# Patient Record
Sex: Female | Born: 1935 | Race: White | Hispanic: No | Smoking: Never smoker
Health system: Southern US, Community
[De-identification: ages and names within clinical notes are randomized; demographics above are authoritative.]

## PROBLEM LIST (undated history)

## (undated) DIAGNOSIS — I10 Essential (primary) hypertension: Secondary | ICD-10-CM

## (undated) HISTORY — PX: TONSILLECTOMY AND ADENOIDECTOMY: SHX28

## (undated) HISTORY — PX: ELBOW LIGAMENT RECONSTRUCTION: SHX6359

---

## 2007-03-07 ENCOUNTER — Encounter: Payer: Self-pay | Admitting: Unknown Physician Specialty

## 2007-04-05 ENCOUNTER — Encounter: Payer: Self-pay | Admitting: Unknown Physician Specialty

## 2007-07-12 ENCOUNTER — Ambulatory Visit: Payer: Self-pay | Admitting: Physician Assistant

## 2007-08-09 ENCOUNTER — Ambulatory Visit: Payer: Self-pay | Admitting: Pain Medicine

## 2007-08-21 ENCOUNTER — Ambulatory Visit: Payer: Self-pay | Admitting: Pain Medicine

## 2007-08-22 ENCOUNTER — Ambulatory Visit: Payer: Self-pay | Admitting: Pain Medicine

## 2007-09-06 ENCOUNTER — Ambulatory Visit: Payer: Self-pay | Admitting: Physician Assistant

## 2007-10-18 ENCOUNTER — Ambulatory Visit: Payer: Self-pay | Admitting: Pain Medicine

## 2007-10-26 ENCOUNTER — Ambulatory Visit: Payer: Self-pay | Admitting: Pain Medicine

## 2007-11-09 ENCOUNTER — Ambulatory Visit: Payer: Self-pay | Admitting: Pain Medicine

## 2007-11-22 ENCOUNTER — Ambulatory Visit: Payer: Self-pay | Admitting: Physician Assistant

## 2008-01-08 ENCOUNTER — Ambulatory Visit: Payer: Self-pay | Admitting: Family Medicine

## 2008-01-12 ENCOUNTER — Ambulatory Visit: Payer: Self-pay | Admitting: Family Medicine

## 2008-01-31 ENCOUNTER — Ambulatory Visit: Payer: Self-pay | Admitting: Family Medicine

## 2008-06-05 LAB — HM PAP SMEAR: HM Pap smear: NEGATIVE

## 2008-06-06 ENCOUNTER — Ambulatory Visit: Payer: Self-pay | Admitting: Family Medicine

## 2008-06-12 ENCOUNTER — Encounter: Payer: Self-pay | Admitting: Family Medicine

## 2008-07-04 ENCOUNTER — Encounter: Payer: Self-pay | Admitting: Family Medicine

## 2008-07-22 ENCOUNTER — Ambulatory Visit: Payer: Self-pay | Admitting: Family Medicine

## 2008-08-04 ENCOUNTER — Encounter: Payer: Self-pay | Admitting: Family Medicine

## 2008-08-08 ENCOUNTER — Ambulatory Visit: Payer: Self-pay | Admitting: Family Medicine

## 2009-03-21 ENCOUNTER — Ambulatory Visit: Payer: Self-pay

## 2009-05-07 ENCOUNTER — Ambulatory Visit: Payer: Self-pay | Admitting: Internal Medicine

## 2009-07-31 DIAGNOSIS — I83009 Varicose veins of unspecified lower extremity with ulcer of unspecified site: Secondary | ICD-10-CM | POA: Insufficient documentation

## 2010-05-31 IMAGING — CR DG CHEST 2V
1 series · 2 of 2 positions shown · non-contrast
Comparison: none

REASON FOR EXAM: ABNORMAL WEIGHT LOSS
COMMENTS:

[Series 1: view not recorded · 0.17mm/px · 2 of 2 slices shown]
[im 1/2]
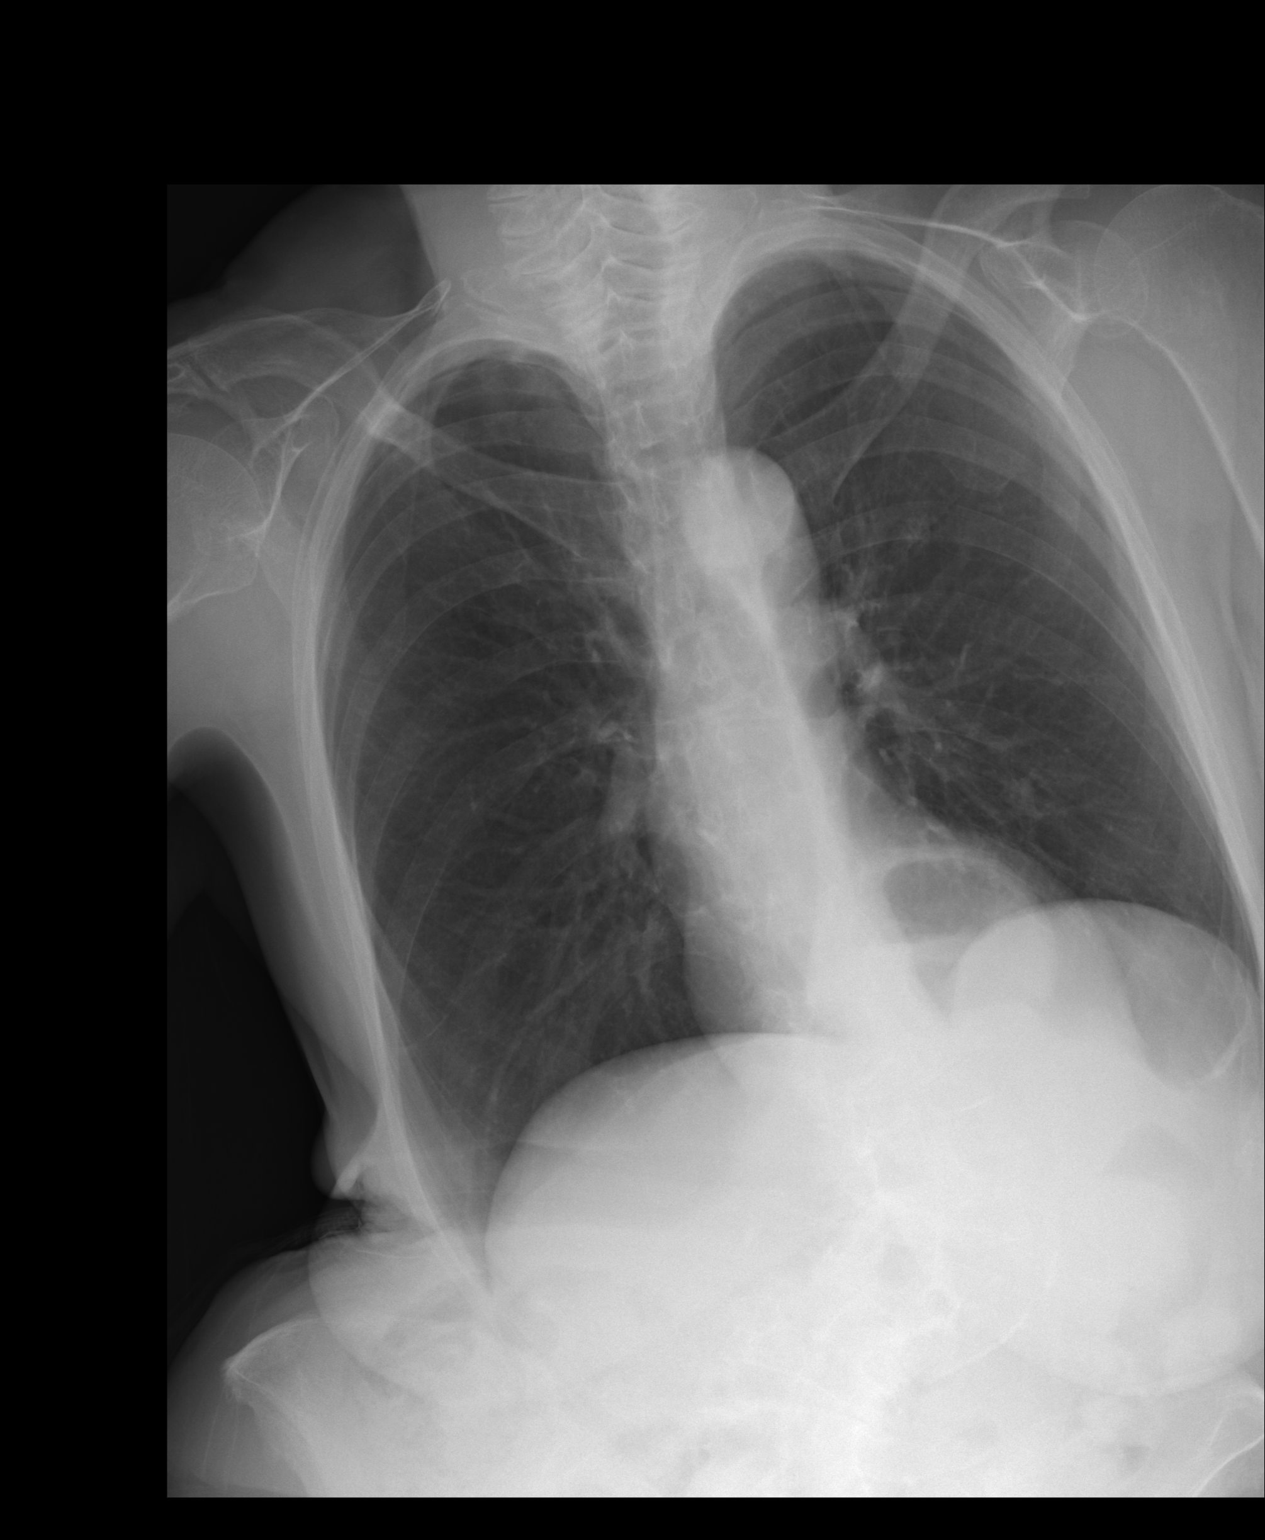
[im 2/2]
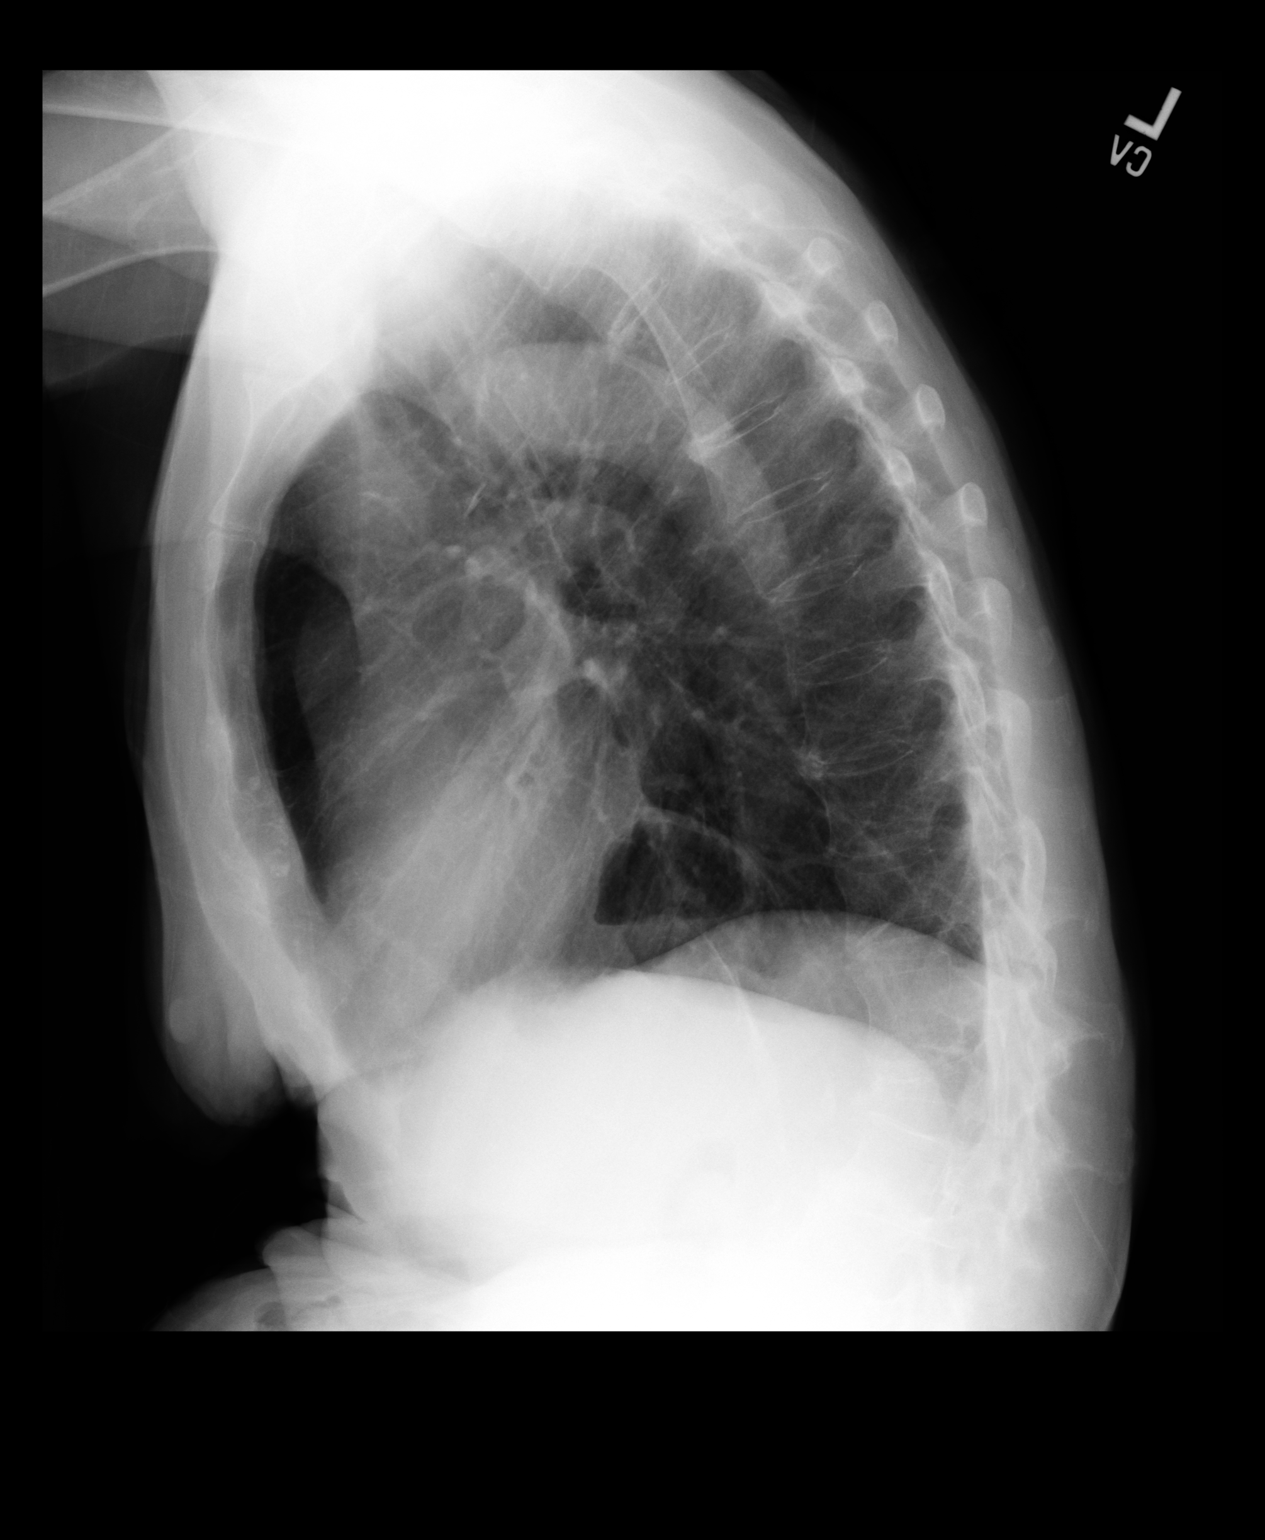

[2 of 2 positions shown; findings below may reference images not displayed]

PROCEDURE:     DXR - DXR CHEST PA (OR AP) AND LATERAL  - January 08, 2008  [DATE]

RESULT:     There is no evidence of focal infiltrates, effusions or edema.
There does appear to be mild increased AP diameter of the chest. An air
fluid level is identified within the retrocardiac region likely representing
a hiatal hernia. The cardiac silhouette is within normal limits. Severe
S-shaped scoliosis is identified within the lower thoracolumbar and lumbar
spine.
IMPRESSION: 1. Chest radiograph without evidence of acute cardiopulmonary disease.

## 2010-06-04 IMAGING — CT CT ABDOMEN W/ CM
1 of 2 series · 14 of 32 positions shown, 19 images · non-contrast
Comparison: none

REASON FOR EXAM: Abnormal weight loss
COMMENTS:

[Series 2: abdomen · axial · 0.63mm/px · z∈[-268,-42]mm · 14 of 51 slices shown, 19 images]
[im 3/51  soft-tissue]
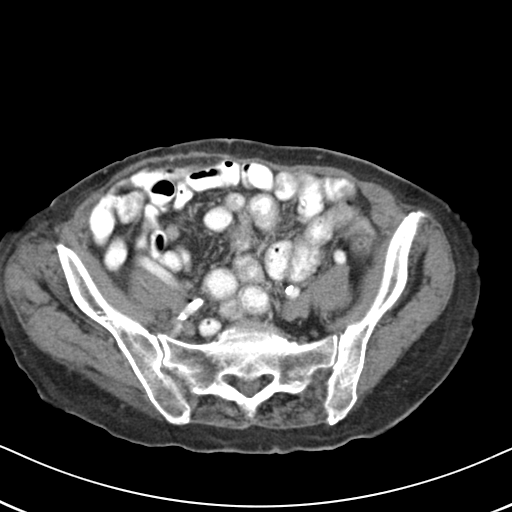
[im 3/51  bone]
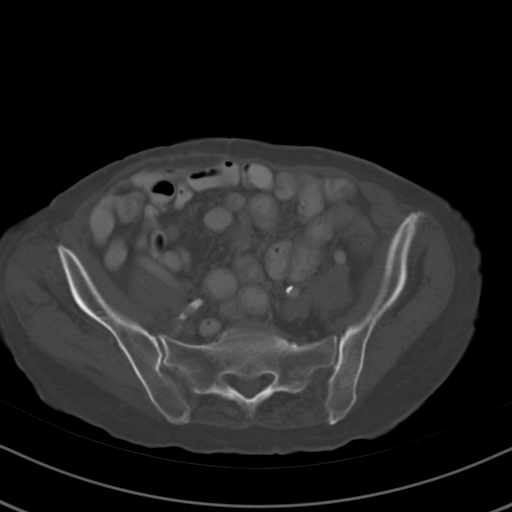
[im 8/51  soft-tissue]
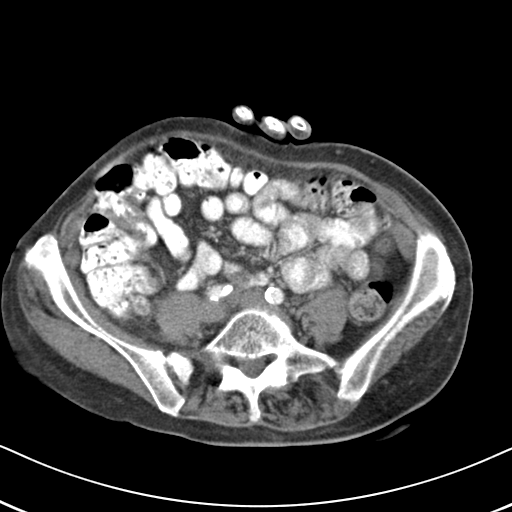
[im 11/51  soft-tissue]
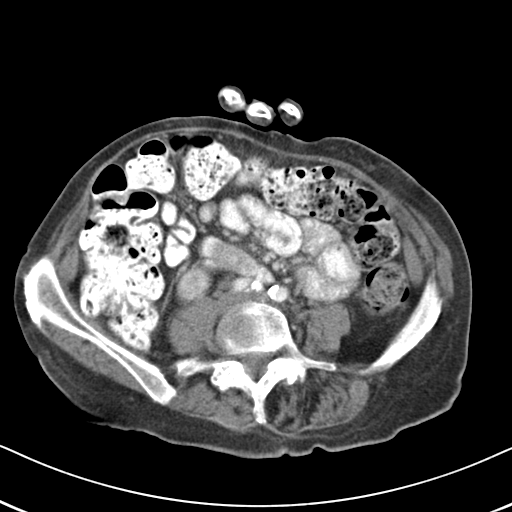
[im 16/51  soft-tissue]
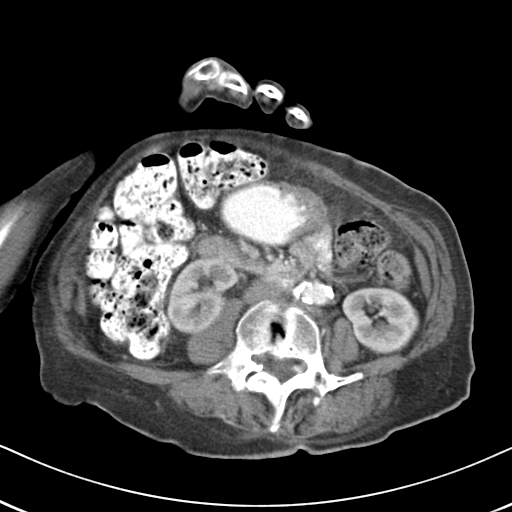
[im 18/51  soft-tissue]
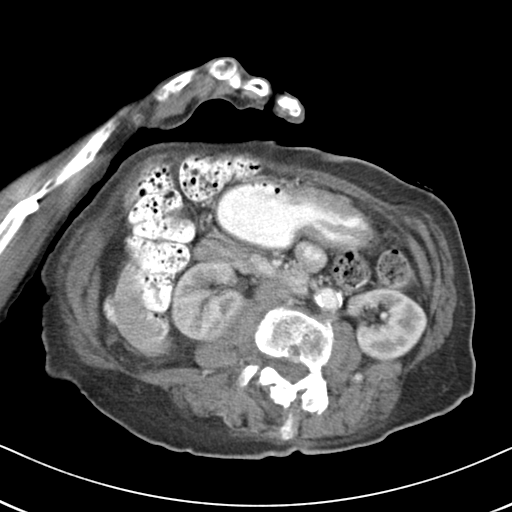
[im 23/51  soft-tissue]
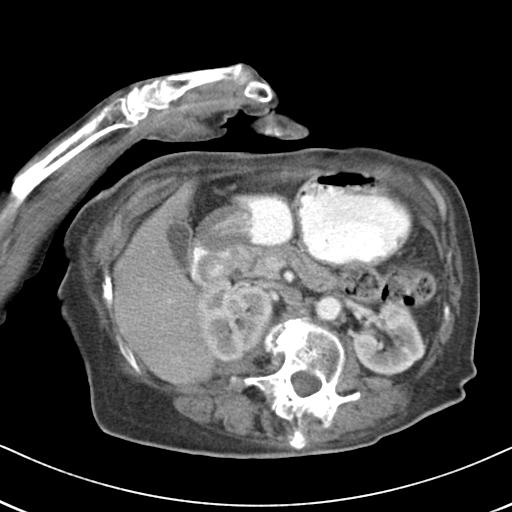
[im 26/51  soft-tissue]
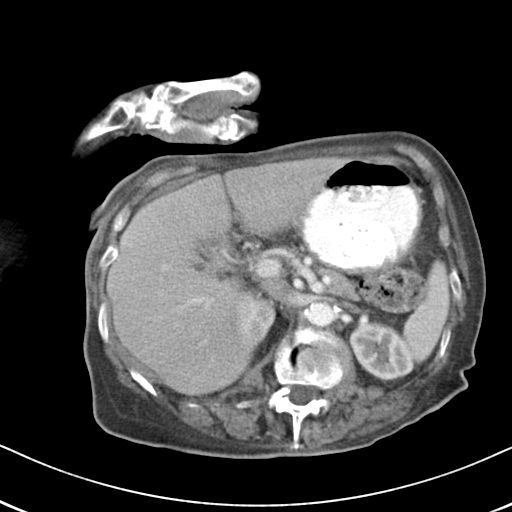
[im 28/51  soft-tissue]
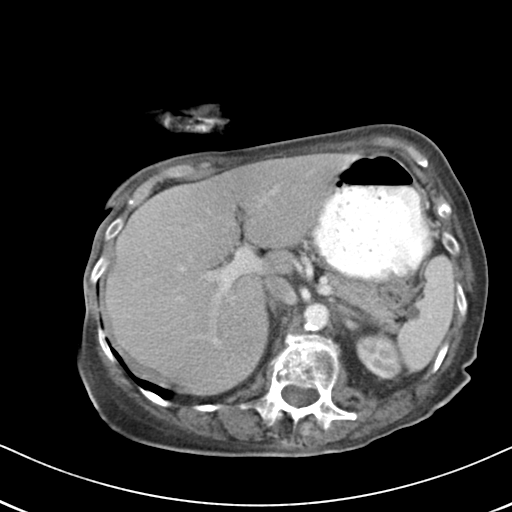
[im 33/51  soft-tissue]
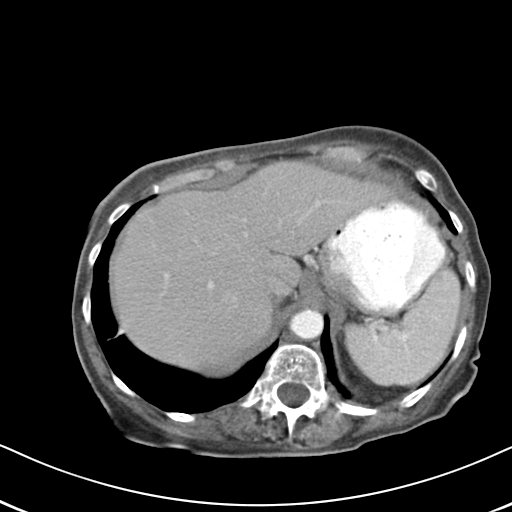
[im 33/51  bone]
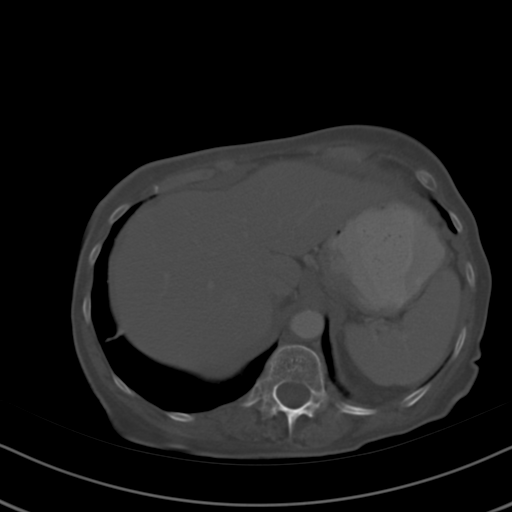
[im 36/51  soft-tissue]
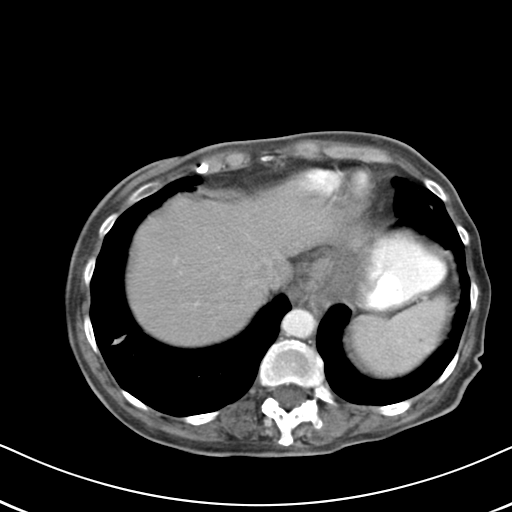
[im 41/51  soft-tissue]
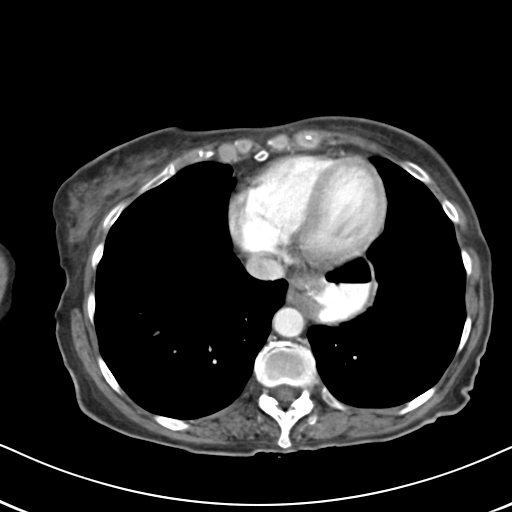
[im 41/51  lung]
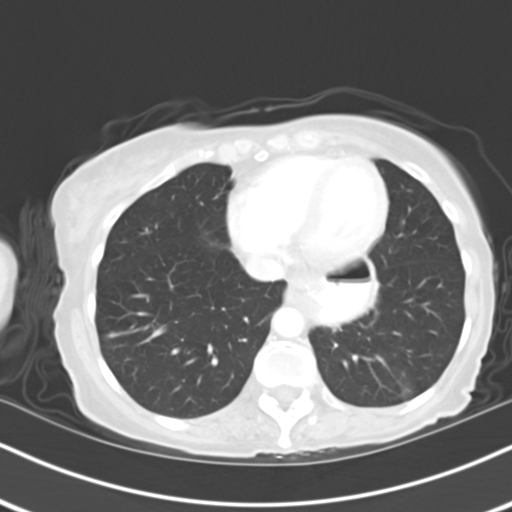
[im 43/51  soft-tissue]
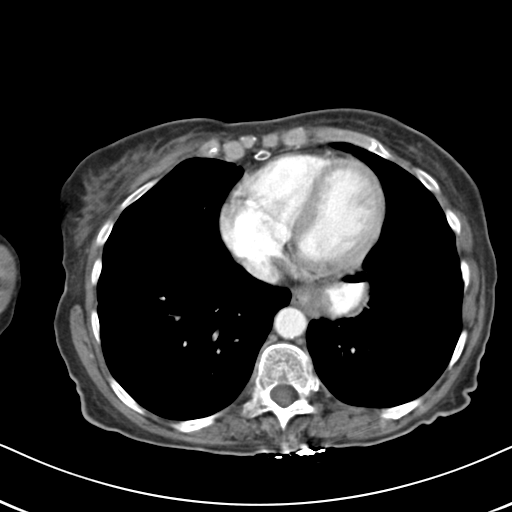
[im 43/51  lung]
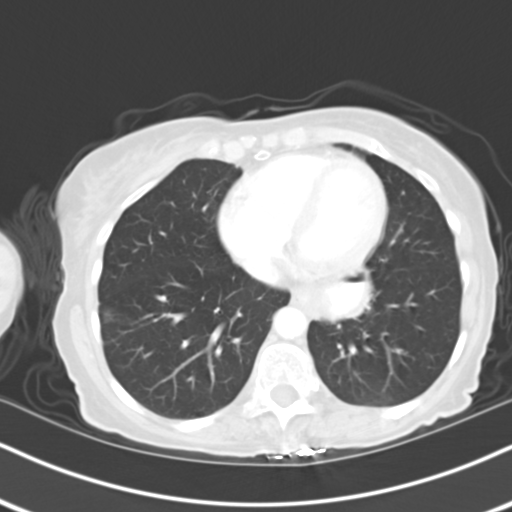
[im 46/51  lung]
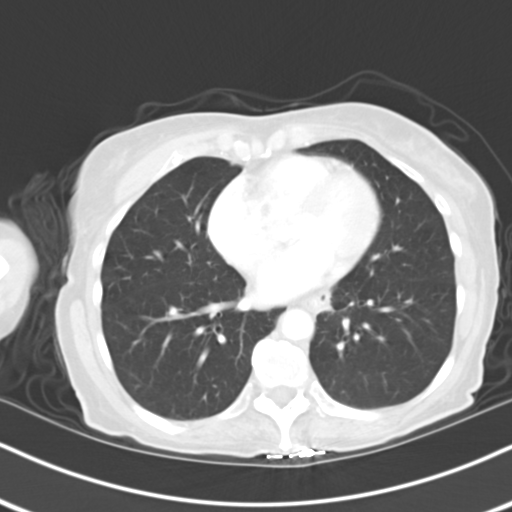
[im 48/51  soft-tissue]
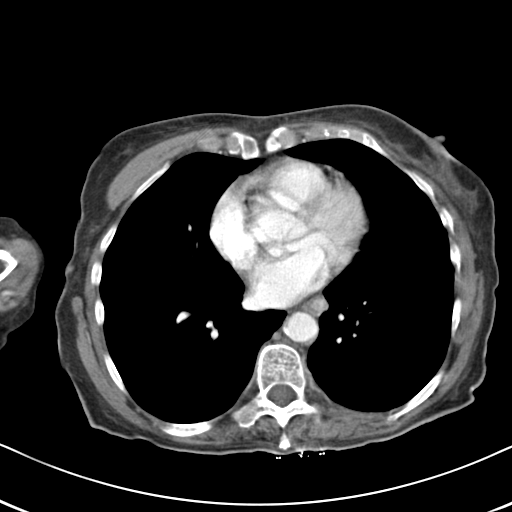
[im 48/51  lung]
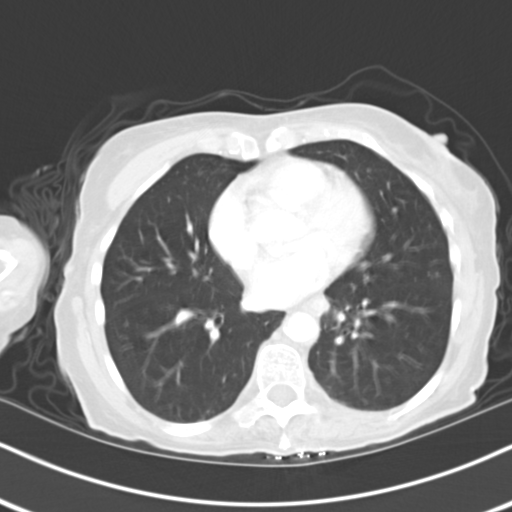

[14 of 32 positions shown; findings below may reference images not displayed]

PROCEDURE:     CT  - CT ABDOMEN STANDARD W  - January 12, 2008  [DATE]

RESULT:     CT of the abdomen and is performed utilizing approximately 75 ml
of Ssovue-IVC iodinated intravenous contrast. Oral contrast is also
utilized. Images are reconstructed in the axial plane at 5 mm slice
thickness.

There is no previous examination available for comparison.
FINDINGS: Images through the lung bases demonstrate respiratory motion
artifact without a discrete mass or definite infiltrate. Some minimal linear
fibrosis or atelectasis is present. There is a large, hiatal hernia. The
liver and spleen appear unremarkable. Prominent atherosclerotic
calcification is seen in the aorta and iliac arteries. No definite aneurysm
is identified. The gallbladder is present with no stones demonstrated. There
is no definite mesenteric or retroperitoneal mass or adenopathy. The
included portions of the pelvis are unremarkable. There is a moderate amount
of stool in the colon. No abnormal bowel distention or bowel wall thickening
is appreciated. The adrenal glands and pancreas appear to be within normal
limits.
IMPRESSION: 1.  Advanced atherosclerotic disease in the aorta and iliac arteries.
2.  Moderately, large, hiatal hernia.
3.  No evidence of malignancy on these images. There is no definite mass or
adenopathy. There is no ascites.

## 2010-11-30 ENCOUNTER — Ambulatory Visit: Payer: Self-pay | Admitting: Family Medicine

## 2011-08-29 ENCOUNTER — Inpatient Hospital Stay: Payer: Self-pay | Admitting: Internal Medicine

## 2011-08-29 LAB — BASIC METABOLIC PANEL
Anion Gap: 11 (ref 7–16)
BUN: 29 mg/dL — ABNORMAL HIGH (ref 7–18)
Calcium, Total: 8.4 mg/dL — ABNORMAL LOW (ref 8.5–10.1)
Co2: 22 mmol/L (ref 21–32)
Creatinine: 0.93 mg/dL (ref 0.60–1.30)
EGFR (African American): >60
EGFR (Non-African Amer.): >60
Glucose: 75 mg/dL (ref 65–99)
Potassium: 3.6 mmol/L (ref 3.5–5.1)
Sodium: 115 mmol/L — CL (ref 136–145)

## 2011-08-29 LAB — COMPREHENSIVE METABOLIC PANEL
Albumin: 2.4 g/dL — ABNORMAL LOW (ref 3.4–5.0)
Albumin: 3.2 g/dL — ABNORMAL LOW (ref 3.4–5.0)
Alkaline Phosphatase: 123 U/L (ref 50–136)
Alkaline Phosphatase: 95 U/L (ref 50–136)
Anion Gap: 13 (ref 7–16)
BUN: 26 mg/dL — ABNORMAL HIGH (ref 7–18)
Bilirubin,Total: 0.4 mg/dL (ref 0.2–1.0)
Bilirubin,Total: 0.5 mg/dL (ref 0.2–1.0)
Calcium, Total: 8.7 mg/dL (ref 8.5–10.1)
Chloride: 77 mmol/L — ABNORMAL LOW (ref 98–107)
Chloride: 88 mmol/L — ABNORMAL LOW (ref 98–107)
Co2: 20 mmol/L — ABNORMAL LOW (ref 21–32)
Creatinine: 0.7 mg/dL (ref 0.60–1.30)
EGFR (African American): >60
EGFR (Non-African Amer.): >60
Glucose: 57 mg/dL — ABNORMAL LOW (ref 65–99)
Glucose: 96 mg/dL (ref 65–99)
Osmolality: 242 (ref 275–301)
SGOT(AST): 32 U/L (ref 15–37)
Sodium: 110 mmol/L — CL (ref 136–145)
Sodium: 119 mmol/L — CL (ref 136–145)
Total Protein: 5.2 g/dL — ABNORMAL LOW (ref 6.4–8.2)
Total Protein: 7.2 g/dL (ref 6.4–8.2)

## 2011-08-29 LAB — URINALYSIS, COMPLETE
Glucose,UR: 50 mg/dL (ref 0–75)
Ketone: NEGATIVE
Leukocyte Esterase: NEGATIVE
Nitrite: NEGATIVE
Squamous Epithelial: <1
WBC UR: 1 /HPF (ref 0–5)

## 2011-08-29 LAB — SODIUM
Sodium: 116 mmol/L — CL (ref 136–145)
Sodium: 118 mmol/L — CL (ref 136–145)
Sodium: 118 mmol/L — CL (ref 136–145)
Sodium: 119 mmol/L — CL (ref 136–145)

## 2011-08-29 LAB — CBC
MCHC: 34.9 g/dL (ref 32.0–36.0)
Platelet: 195 10*3/uL (ref 150–440)
RBC: 3.89 10*6/uL (ref 3.80–5.20)
RDW: 13.6 % (ref 11.5–14.5)
WBC: 8.3 10*3/uL (ref 3.6–11.0)

## 2011-08-29 LAB — OSMOLALITY, URINE: Osmolality: 241 mOsm/kg

## 2011-08-29 LAB — TROPONIN I: Troponin-I: <0.02 ng/mL

## 2011-08-29 LAB — OSMOLALITY: Osmolality: 234 mOsm/kg — CL (ref 280–301)

## 2011-08-29 LAB — CK TOTAL AND CKMB (NOT AT ARMC): CK-MB: 3.2 ng/mL (ref 0.5–3.6)

## 2011-08-30 LAB — COMPREHENSIVE METABOLIC PANEL
Anion Gap: 9 (ref 7–16)
BUN: 26 mg/dL — ABNORMAL HIGH (ref 7–18)
Calcium, Total: 8 mg/dL — ABNORMAL LOW (ref 8.5–10.1)
Chloride: 92 mmol/L — ABNORMAL LOW (ref 98–107)
Co2: 21 mmol/L (ref 21–32)
Osmolality: 249 (ref 275–301)
Potassium: 3.6 mmol/L (ref 3.5–5.1)
SGOT(AST): 22 U/L (ref 15–37)
SGPT (ALT): 13 U/L (ref 12–78)
Sodium: 122 mmol/L — ABNORMAL LOW (ref 136–145)
Total Protein: 5.7 g/dL — ABNORMAL LOW (ref 6.4–8.2)

## 2011-08-30 LAB — CBC WITH DIFFERENTIAL/PLATELET
Basophil #: 0.1 10*3/uL (ref 0.0–0.1)
Basophil %: 0.8 %
Eosinophil %: 0.4 %
HCT: 36.6 % (ref 35.0–47.0)
HGB: 12.8 g/dL (ref 12.0–16.0)
Lymphocyte #: 1.3 10*3/uL (ref 1.0–3.6)
MCH: 30.8 pg (ref 26.0–34.0)
MCHC: 34.8 g/dL (ref 32.0–36.0)
MCV: 89 fL (ref 80–100)
Monocyte #: 1.1 x10 3/mm — ABNORMAL HIGH (ref 0.2–0.9)
Platelet: 179 10*3/uL (ref 150–440)
RBC: 4.14 10*6/uL (ref 3.80–5.20)
WBC: 9 10*3/uL (ref 3.6–11.0)

## 2011-08-30 LAB — LIPID PANEL
Cholesterol: 133 mg/dL (ref 0–200)
HDL Cholesterol: 47 mg/dL (ref 40–60)
Triglycerides: 78 mg/dL (ref 0–200)
VLDL Cholesterol, Calc: 16 mg/dL (ref 5–40)

## 2011-08-30 LAB — SODIUM: Sodium: 124 mmol/L — ABNORMAL LOW (ref 136–145)

## 2011-08-31 LAB — PROTEIN / CREATININE RATIO, URINE
Creatinine, Urine: 37.3 mg/dL (ref 30.0–125.0)
Protein, Random Urine: 9 mg/dL (ref 0–12)
Protein/Creat. Ratio: 241 mg/gCREAT — ABNORMAL HIGH (ref 0–200)

## 2011-08-31 LAB — SODIUM
Sodium: 129 mmol/L — ABNORMAL LOW (ref 136–145)
Sodium: 129 mmol/L — ABNORMAL LOW (ref 136–145)

## 2011-08-31 LAB — CHLORIDE, URINE, RANDOM: Chloride, Urine Random: 53 mmol/L — ABNORMAL LOW (ref 55–125)

## 2011-09-01 LAB — BASIC METABOLIC PANEL
Anion Gap: 6 — ABNORMAL LOW (ref 7–16)
BUN: 19 mg/dL — ABNORMAL HIGH (ref 7–18)
Chloride: 103 mmol/L (ref 98–107)
Co2: 24 mmol/L (ref 21–32)
EGFR (African American): >60
EGFR (Non-African Amer.): >60
Glucose: 76 mg/dL (ref 65–99)
Osmolality: 267 (ref 275–301)
Potassium: 3.8 mmol/L (ref 3.5–5.1)
Sodium: 133 mmol/L — ABNORMAL LOW (ref 136–145)

## 2011-09-02 ENCOUNTER — Encounter: Payer: Self-pay | Admitting: Internal Medicine

## 2011-09-05 ENCOUNTER — Encounter: Payer: Self-pay | Admitting: Internal Medicine

## 2011-09-14 LAB — URINALYSIS, COMPLETE
Bilirubin,UR: NEGATIVE
Glucose,UR: NEGATIVE mg/dL (ref 0–75)
Hyaline Cast: 4
Nitrite: NEGATIVE
Ph: 6 (ref 4.5–8.0)
Protein: NEGATIVE
Specific Gravity: 1.011 (ref 1.003–1.030)
Squamous Epithelial: <1
WBC UR: 1 /HPF (ref 0–5)

## 2011-09-16 LAB — URINE CULTURE

## 2013-06-13 ENCOUNTER — Ambulatory Visit: Payer: Self-pay | Admitting: Family Medicine

## 2013-06-13 LAB — HM DEXA SCAN

## 2013-08-03 LAB — CBC AND DIFFERENTIAL
HCT: 39 % (ref 36–46)
Hemoglobin: 13.6 g/dL (ref 12.0–16.0)
PLATELETS: 235 10*3/uL (ref 150–399)
WBC: 4.7 10^3/mL

## 2013-08-03 LAB — BASIC METABOLIC PANEL
BUN: 13 mg/dL (ref 4–21)
CREATININE: 0.8 mg/dL (ref 0.5–1.1)
GLUCOSE: 95 mg/dL
POTASSIUM: 4.1 mmol/L (ref 3.4–5.3)
SODIUM: 139 mmol/L (ref 137–147)

## 2013-08-03 LAB — HEPATIC FUNCTION PANEL
ALT: 12 U/L (ref 7–35)
AST: 18 U/L (ref 13–35)

## 2014-01-19 IMAGING — CR DG CHEST 1V PORT
1 series · 1 of 1 positions shown · non-contrast
Comparison: none

REASON FOR EXAM: line placement
COMMENTS:

PROCEDURE:     DXR - DXR PORTABLE CHEST SINGLE VIEW  - August 29, 2011  [DATE]
RESULT:     From the chest dated 08/29/2011. Comparison made to prior study
dated 08/28/2011.

[portable]
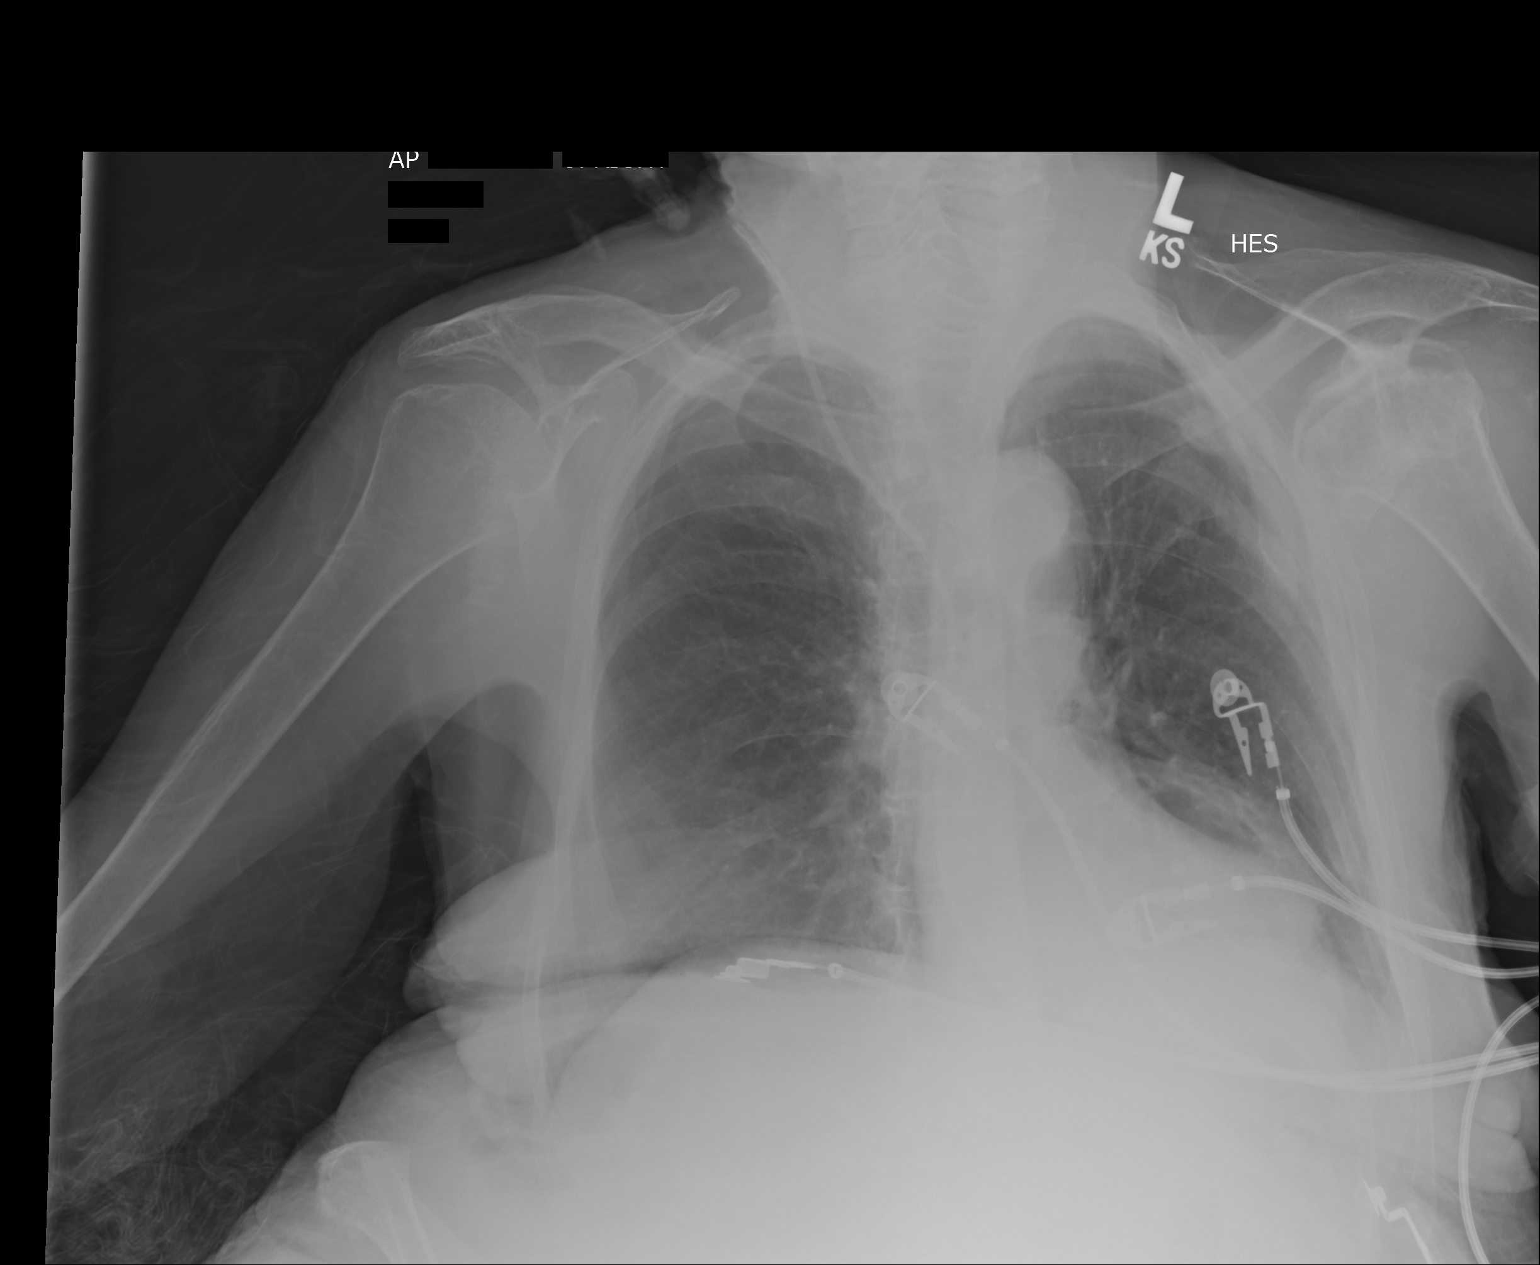

[1 of 1 positions shown; findings below may reference images not displayed]

FINDINGS: A right-sided internal jugular catheter is appreciated with tip
projecting in the region of the right atrium. There is no evidence of
pneumothorax. Is prominence of interstitial markings. Within the left lung
base there is an area of atelectasis versus possibly infiltrate projecting
along the superior aspect of the left ventricle.
IMPRESSION: Patient status post right internal jugular catheter
placement the tip projecting in the region of the right atrium. There is no
evidence of pneumothorax.
2. Atelectasis versus infiltrate on the left.

## 2014-04-23 NOTE — Discharge Summary (Signed)
PATIENT NAME:  Jasmin GrumblingWRIGHT, Elwanda S MR#:  [id number] DATE OF BIRTH:  October 24, 1933  DATE OF ADMISSION:  08/29/2011 DATE OF DISCHARGE:    Discharge is pending bed opening at rehab.   PRESENTING COMPLAINT: Weakness, altered mental status.   DISCHARGE DIAGNOSES:  1. Hypo-osmolar, hypovolemia, hyponatremia. The patient's sodium improved to 133.  2. Urinary tract infection, resolved.  3. Hypoalbuminemia consistent with moderate malnutrition.  4. Hypertension.  5. Generalized weakness.   CONDITION ON DISCHARGE: Fair.   MEDICATIONS ON DISCHARGE:  1. Nexium 40 mg p.o. daily.  2. Calcium with Vitamin D 1 tablet b.i.d.  3. Fosamax 70 mg p.o. q. weekly.  4. Tylenol Extra Strength 500 mg q.6 p.r.n.  5. Amlodipine 5 mg daily.  6. Lovaza 1 gram orally daily. 7. Bacitracin to affected area t.i.d. as needed.  8. Ultram 50 mg p.o. q.6 p.r.n. for pain.   CONSULTATION: Nephrology consultation with Dr. Thedore MinsSingh for hyponatremia   LABORATORY, DIAGNOSTIC, AND RADIOLOGICAL DATA: Serum sodium 133, glucose 76, BUN 19, creatinine 0.66, potassium 3.8. Protein electrophoresis M-spike noted. Urine chloride 53. Urine creatinine is 37.3. Urine sodium is 48. Free Kappa and Lambda Light chains are within normal limits. Serum protein electrophoresis M-spike not observed. Serum cortisol is 20.4. Lipid profile within normal limits. Serum sodium on admission was 110. Urinalysis negative for urinary tract infection. Blood cultures negative.   BRIEF SUMMARY OF HOSPITAL COURSE: [name] is a 81 year old Caucasian female with history of hypertension who came in with:  1. Altered mental status which was suspected due to hyponatremia and dehydration. She was diagnosed with hypo-osmolar, hypovolemia, and hyponatremia. She was on IV hypertonic saline. Sodium improved. Changed to normal saline and now discontinued since the patient is doing well and her sodium is up to 133. She is alert and better.  2. Hypotension. Her hydrochlorothiazide  was discontinued. She is currently on low dose of Norvasc and blood pressure is well maintained.  3. DVT prophylaxis with sub-Q heparin 5000 b.i.d.  4. GI prophylaxis. She was continued on PPI.  5. Generalized weakness and deconditioning. Physical therapy recommended rehab. Will discharge her to short-term rehab upon bed availability.   TIME SPENT: 40 minutes.   ____________________________ Wylie HailSona A. Allena KatzPatel, MD sap:drc D: 09/02/2011 11:54:09 ET T: 09/02/2011 14:33:11 ET JOB#: [id number]  cc: Yuritza Paulhus A. Allena KatzPatel, MD, <Dictator> Leo GrosserNancy J. Maloney, MD Willow OraSONA A Arline Ketter MD ELECTRONICALLY SIGNED 09/07/2011 15:45
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
## 2014-06-14 ENCOUNTER — Other Ambulatory Visit: Payer: Self-pay

## 2014-06-14 ENCOUNTER — Telehealth: Payer: Self-pay

## 2014-06-14 MED ORDER — TRAMADOL HCL 50 MG PO TABS: 50.0000 mg | ORAL_TABLET | ORAL | Status: DC | PRN

## 2014-06-14 NOTE — Telephone Encounter (Signed)
Please look at dosing in [employer] and put in as med order and send back. Thanks.

## 2014-06-14 NOTE — Telephone Encounter (Signed)
Faxed Prescription to [facility], today.

## 2014-06-14 NOTE — Telephone Encounter (Signed)
[facility] requesting a prescription refill for Jasmin Roth for Tramadol.

## 2014-06-17 ENCOUNTER — Other Ambulatory Visit: Payer: Self-pay

## 2014-06-17 DIAGNOSIS — M419 Scoliosis, unspecified: Secondary | ICD-10-CM | POA: Insufficient documentation

## 2014-06-17 MED ORDER — TRAMADOL HCL 50 MG PO TABS: 50.0000 mg | ORAL_TABLET | ORAL | Status: DC | PRN

## 2014-08-01 ENCOUNTER — Other Ambulatory Visit: Payer: Self-pay

## 2014-08-01 DIAGNOSIS — I10 Essential (primary) hypertension: Secondary | ICD-10-CM | POA: Insufficient documentation

## 2014-08-01 MED ORDER — LISINOPRIL 40 MG PO TABS: 40.0000 mg | ORAL_TABLET | Freq: Every day | ORAL | Status: DC

## 2014-08-01 NOTE — Telephone Encounter (Signed)
Last OV 08/03/2013  Thanks,   -Laura  

## 2014-09-03 ENCOUNTER — Other Ambulatory Visit: Payer: Self-pay

## 2014-09-03 DIAGNOSIS — M419 Scoliosis, unspecified: Secondary | ICD-10-CM

## 2014-09-03 MED ORDER — TRAMADOL HCL 50 MG PO TABS: 50.0000 mg | ORAL_TABLET | ORAL | Status: DC | PRN

## 2014-09-03 NOTE — Telephone Encounter (Signed)
Ok to phone in rx. Thanks.  

## 2014-09-03 NOTE — Telephone Encounter (Signed)
Next apt 11/21/2014  Thanks,   -Vernona Rieger

## 2014-09-04 ENCOUNTER — Other Ambulatory Visit: Payer: Self-pay | Admitting: Family Medicine

## 2014-09-04 NOTE — Telephone Encounter (Signed)
Rx called in.   Thanks,   -Onya Eutsler  

## 2014-09-04 NOTE — Telephone Encounter (Signed)
Pt states [facility] has not rec'd the refill request for traMADol (ULTRAM) 50 MG.  CB#[phone]/MW

## 2014-11-19 DIAGNOSIS — R2681 Unsteadiness on feet: Secondary | ICD-10-CM | POA: Insufficient documentation

## 2014-11-19 DIAGNOSIS — K219 Gastro-esophageal reflux disease without esophagitis: Secondary | ICD-10-CM | POA: Insufficient documentation

## 2014-11-19 DIAGNOSIS — F4320 Adjustment disorder, unspecified: Secondary | ICD-10-CM | POA: Insufficient documentation

## 2014-11-19 DIAGNOSIS — Z6826 Body mass index (BMI) 26.0-26.9, adult: Secondary | ICD-10-CM | POA: Insufficient documentation

## 2014-11-19 DIAGNOSIS — E876 Hypokalemia: Secondary | ICD-10-CM | POA: Insufficient documentation

## 2014-11-19 DIAGNOSIS — R0683 Snoring: Secondary | ICD-10-CM | POA: Insufficient documentation

## 2014-11-19 DIAGNOSIS — E871 Hypo-osmolality and hyponatremia: Secondary | ICD-10-CM | POA: Insufficient documentation

## 2014-11-19 DIAGNOSIS — R319 Hematuria, unspecified: Secondary | ICD-10-CM | POA: Insufficient documentation

## 2014-11-21 ENCOUNTER — Encounter: Payer: Self-pay | Admitting: Family Medicine

## 2014-12-02 ENCOUNTER — Other Ambulatory Visit: Payer: Self-pay

## 2014-12-02 DIAGNOSIS — I10 Essential (primary) hypertension: Secondary | ICD-10-CM

## 2014-12-02 DIAGNOSIS — F419 Anxiety disorder, unspecified: Secondary | ICD-10-CM

## 2014-12-02 MED ORDER — ESCITALOPRAM OXALATE 5 MG PO TABS: 5.0000 mg | ORAL_TABLET | Freq: Every day | ORAL | Status: DC

## 2014-12-02 MED ORDER — METOPROLOL TARTRATE 100 MG PO TABS: 100.0000 mg | ORAL_TABLET | Freq: Two times a day (BID) | ORAL | Status: DC

## 2014-12-19 ENCOUNTER — Other Ambulatory Visit: Payer: Self-pay

## 2014-12-19 DIAGNOSIS — M419 Scoliosis, unspecified: Secondary | ICD-10-CM

## 2014-12-19 MED ORDER — TRAMADOL HCL 50 MG PO TABS: 50.0000 mg | ORAL_TABLET | ORAL | Status: DC | PRN

## 2014-12-19 NOTE — Telephone Encounter (Signed)
Printed, please fax or call in to pharmacy. Thank you.   

## 2014-12-19 NOTE — Telephone Encounter (Signed)
Pt called wanting refill on her tramadol.  [facility]  traMADol (ULTRAM) 50 MG tablet   Thanks Barth Kirkseri

## 2014-12-27 ENCOUNTER — Other Ambulatory Visit: Payer: Self-pay

## 2014-12-27 DIAGNOSIS — I10 Essential (primary) hypertension: Secondary | ICD-10-CM

## 2014-12-27 MED ORDER — METOPROLOL TARTRATE 100 MG PO TABS
100.0000 mg | ORAL_TABLET | Freq: Two times a day (BID) | ORAL | Status: DC
Start: 2014-12-27 — End: 2015-03-03

## 2015-01-24 ENCOUNTER — Encounter: Payer: Self-pay | Admitting: Family Medicine

## 2015-03-03 ENCOUNTER — Other Ambulatory Visit: Payer: Self-pay | Admitting: Family Medicine

## 2015-03-03 DIAGNOSIS — I10 Essential (primary) hypertension: Secondary | ICD-10-CM

## 2015-03-13 ENCOUNTER — Encounter: Payer: Self-pay | Admitting: Family Medicine

## 2015-03-29 ENCOUNTER — Other Ambulatory Visit: Payer: Self-pay | Admitting: Family Medicine

## 2015-03-31 ENCOUNTER — Other Ambulatory Visit: Payer: Self-pay | Admitting: Family Medicine

## 2015-03-31 NOTE — Telephone Encounter (Signed)
Last OV 07/23/13

## 2015-05-09 ENCOUNTER — Ambulatory Visit: Payer: Self-pay | Admitting: Family Medicine

## 2015-05-13 ENCOUNTER — Encounter: Payer: Self-pay | Admitting: Family Medicine

## 2015-05-13 ENCOUNTER — Telehealth: Payer: Self-pay | Admitting: Family Medicine

## 2015-05-19 ENCOUNTER — Ambulatory Visit: Payer: Self-pay | Admitting: Family Medicine

## 2015-05-27 ENCOUNTER — Ambulatory Visit (INDEPENDENT_AMBULATORY_CARE_PROVIDER_SITE_OTHER): Payer: Medicare Other | Admitting: Family Medicine

## 2015-05-27 ENCOUNTER — Encounter: Payer: Self-pay | Admitting: Family Medicine

## 2015-05-27 VITALS — BP 204/108 | HR 88 | Temp 98.9°F | Resp 16 | Ht <= 58 in | Wt 119.0 lb

## 2015-05-27 DIAGNOSIS — F419 Anxiety disorder, unspecified: Secondary | ICD-10-CM | POA: Diagnosis not present

## 2015-05-27 DIAGNOSIS — M1990 Unspecified osteoarthritis, unspecified site: Secondary | ICD-10-CM | POA: Insufficient documentation

## 2015-05-27 DIAGNOSIS — M810 Age-related osteoporosis without current pathological fracture: Secondary | ICD-10-CM | POA: Insufficient documentation

## 2015-05-27 DIAGNOSIS — I10 Essential (primary) hypertension: Secondary | ICD-10-CM

## 2015-05-27 DIAGNOSIS — J309 Allergic rhinitis, unspecified: Secondary | ICD-10-CM

## 2015-05-27 DIAGNOSIS — R059 Cough, unspecified: Secondary | ICD-10-CM

## 2015-05-27 DIAGNOSIS — R05 Cough: Secondary | ICD-10-CM

## 2015-05-27 DIAGNOSIS — E785 Hyperlipidemia, unspecified: Secondary | ICD-10-CM | POA: Insufficient documentation

## 2015-05-27 MED ORDER — LISINOPRIL 40 MG PO TABS: 40.0000 mg | ORAL_TABLET | Freq: Every day | ORAL | Status: DC

## 2015-05-27 MED ORDER — METOPROLOL TARTRATE 100 MG PO TABS: ORAL_TABLET | ORAL | Status: DC

## 2015-05-27 MED ORDER — CLONIDINE HCL 0.1 MG/24HR TD PTWK: 0.1000 mg | MEDICATED_PATCH | TRANSDERMAL | Status: DC

## 2015-05-27 MED ORDER — LORATADINE 10 MG PO TABS: 10.0000 mg | ORAL_TABLET | Freq: Every day | ORAL | Status: DC

## 2015-05-27 MED ORDER — ACETAMINOPHEN 500 MG PO TABS: 1000.0000 mg | ORAL_TABLET | Freq: Three times a day (TID) | ORAL | Status: DC | PRN

## 2015-05-27 MED ORDER — ESCITALOPRAM OXALATE 5 MG PO TABS: 5.0000 mg | ORAL_TABLET | Freq: Every day | ORAL | Status: DC

## 2015-05-27 NOTE — Progress Notes (Signed)
Patient ID: Jasmin Roth, female   DOB: 05/23/1933, 82 y.o.   MRN: [id number]         Patient: Jasmin RaringMary Sue Roth Female    DOB: 08/17/1933   82 y.o.   MRN: [id number] Visit Date: 05/27/2015  Today's Provider: Lorie PhenixNancy Bethann Qualley, MD   Chief Complaint  Patient presents with  . Hypertension  . Anxiety   Subjective:    Hypertension This is a chronic problem. The problem has been gradually worsening (Pt ran out of some of her BP medications. ) since onset. The problem is controlled. Associated symptoms include anxiety. Pertinent negatives include no blurred vision, chest pain, headaches, malaise/fatigue, neck pain, orthopnea, palpitations, peripheral edema or shortness of breath.  Anxiety Presents for follow-up visit. The problem has been unchanged (Doing well on current medications. ). Patient reports no chest pain, depressed mood, excessive worry, hyperventilation, impotence, insomnia, irritability, nausea, nervous/anxious behavior, palpitations, panic, shortness of breath or suicidal ideas. The quality of sleep is good.   The treatment provided significant relief. Compliance with prior treatments has been good.       No Known Allergies Previous Medications   ACETAMINOPHEN (TYLENOL) 500 MG TABLET    Take 500 mg by mouth every 6 (six) hours as needed.   CLONIDINE (CATAPRES-TTS-1) 0.1 MG/24HR PATCH    Place onto the skin. Reported on 05/27/2015   ESCITALOPRAM (LEXAPRO) 5 MG TABLET    Take 1 tablet (5 mg total) by mouth daily.   LISINOPRIL (PRINIVIL,ZESTRIL) 40 MG TABLET    Take 1 tablet (40 mg total) by mouth daily.   METOPROLOL (LOPRESSOR) 100 MG TABLET    TAKE ONE (1) TABLET BY MOUTH TWO (2) TIMES DAILY   TRAMADOL (ULTRAM) 50 MG TABLET    Take 1 tablet (50 mg total) by mouth every 4 (four) hours as needed.    Review of Systems  Constitutional: Negative.  Negative for malaise/fatigue and irritability.  Eyes: Negative for blurred vision.  Respiratory: Positive for cough (Has a dry cough;  that has been going on for several months/years.  Worsening in the last few months. ). Negative for apnea, choking, chest tightness, shortness of breath, wheezing and stridor.   Cardiovascular: Positive for leg swelling (Feet swell occasionally). Negative for chest pain, palpitations and orthopnea.  Gastrointestinal: Positive for diarrhea (Occasional). Negative for nausea, vomiting, abdominal pain, constipation, blood in stool, abdominal distention, anal bleeding and rectal pain.  Genitourinary: Negative for impotence.  Musculoskeletal: Negative.  Negative for neck pain.  Neurological: Negative for headaches.  Psychiatric/Behavioral: Negative for suicidal ideas. The patient is not nervous/anxious and does not have insomnia.     Social History  Substance Use Topics  . Smoking status: Never Smoker   . Smokeless tobacco: Never Used  . Alcohol Use: No   Objective:   BP 204/108 mmHg  Pulse 88  Temp(Src) 98.9 F (37.2 C) (Oral)  Resp 16  Ht 4\' 8"  (1.422 m)  Wt 119 lb (53.978 kg)  BMI 26.69 kg/m2  Physical Exam  Constitutional: She is oriented to person, place, and time. She appears well-developed and well-nourished.  HENT:  Head: Normocephalic and atraumatic.  Right Ear: Tympanic membrane, external ear and ear canal normal.  Left Ear: Tympanic membrane, external ear and ear canal normal.  Nose: Mucosal edema present.  Mouth/Throat: Uvula is midline, oropharynx is clear and moist and mucous membranes are normal.  Cardiovascular: Normal rate and regular rhythm.   Pulmonary/Chest: Effort normal and breath sounds normal.  Musculoskeletal:  Marked  kyphosis and scoliosis noted.    Neurological: She is alert and oriented to person, place, and time.  Skin: Skin is warm and dry.  Psychiatric: She has a normal mood and affect. Her behavior is normal. Judgment and thought content normal.        Assessment & Plan:     1. Anxiety Stable; continue current medications.  Refills provided.    - escitalopram (LEXAPRO) 5 MG tablet; Take 1 tablet (5 mg total) by mouth daily.  Dispense: 90 tablet; Refill: 1 - CBC with Differential/Platelet - Comprehensive metabolic panel - TSH - Lipid panel  2. Benign hypertension Not controlled; will restart Metoprolol; and Clonidine;  Recheck at her physical with Dr. Sherrie Mustache.  - metoprolol (LOPRESSOR) 100 MG tablet; TAKE ONE (1) TABLET BY MOUTH TWO (2) TIMES DAILY  Dispense: 180 tablet; Refill: 1 - cloNIDine (CATAPRES-TTS-1) 0.1 mg/24hr patch; Place 1 patch (0.1 mg total) onto the skin once a week. Reported on 05/27/2015  Dispense: 12 patch; Refill: 1 - CBC with Differential/Platelet - Comprehensive metabolic panel - TSH - Lipid panel  3. Essential hypertension As above.   - lisinopril (PRINIVIL,ZESTRIL) 40 MG tablet; Take 1 tablet (40 mg total) by mouth daily.  Dispense: 90 tablet; Refill: 1  4. Arthritis Has not needed tramadol; pt reports pain is under good control with Tylenol.  - acetaminophen (TYLENOL) 500 MG tablet; Take 2 tablets (1,000 mg total) by mouth every 8 (eight) hours as needed.  Dispense: 90 tablet; Refill: 1  5. Allergic rhinitis, unspecified allergic rhinitis type Could be the cause of her cough will try loratadine as below.  - loratadine (CLARITIN) 10 MG tablet; Take 1 tablet (10 mg total) by mouth daily.  Dispense: 90 tablet; Refill: 1  6. Cough Chronic issue; but seems to be worsening in the last few months. No weight loss.   Will obtain a chest x-ray.   - DG Chest 2 View   Patient was seen and examined by Leo Grosser, MD, and note scribed by Kavin Leech, CMA.   I have reviewed the document for accuracy and completeness and I agree with above. - Leo Grosser, MD  Lorie Phenix, MD  [facility]

## 2015-05-28 ENCOUNTER — Telehealth: Payer: Self-pay

## 2015-05-28 LAB — CBC WITH DIFFERENTIAL/PLATELET
BASOS: 0 %
Basophils Absolute: 0 10*3/uL (ref 0.0–0.2)
EOS (ABSOLUTE): 0 10*3/uL (ref 0.0–0.4)
Eos: 1 %
HEMATOCRIT: 34.9 % (ref 34.0–46.6)
Hemoglobin: 11.8 g/dL (ref 11.1–15.9)
IMMATURE GRANS (ABS): 0.1 10*3/uL (ref 0.0–0.1)
IMMATURE GRANULOCYTES: 2 %
LYMPHS: 28 %
Lymphocytes Absolute: 1.3 10*3/uL (ref 0.7–3.1)
MCH: 32.6 pg (ref 26.6–33.0)
MCHC: 33.8 g/dL (ref 31.5–35.7)
MCV: 96 fL (ref 79–97)
MONOS ABS: 1.6 10*3/uL — AB (ref 0.1–0.9)
Monocytes: 35 %
NEUTROS PCT: 34 %
Neutrophils Absolute: 1.6 10*3/uL (ref 1.4–7.0)
PLATELETS: 236 10*3/uL (ref 150–379)
RBC: 3.62 x10E6/uL — AB (ref 3.77–5.28)
RDW: 17 % — AB (ref 12.3–15.4)
WBC: 4.6 10*3/uL (ref 3.4–10.8)

## 2015-05-28 LAB — COMPREHENSIVE METABOLIC PANEL
ALK PHOS: 75 IU/L (ref 39–117)
ALT: 14 IU/L (ref 0–32)
AST: 23 IU/L (ref 0–40)
Albumin/Globulin Ratio: 1.5 (ref 1.2–2.2)
Albumin: 4.5 g/dL (ref 3.5–4.8)
BUN/Creatinine Ratio: 20 (ref 12–28)
BUN: 16 mg/dL (ref 8–27)
Bilirubin Total: 0.8 mg/dL (ref 0.0–1.2)
CALCIUM: 9.5 mg/dL (ref 8.7–10.3)
CO2: 22 mmol/L (ref 18–29)
CREATININE: 0.82 mg/dL (ref 0.57–1.00)
Chloride: 102 mmol/L (ref 96–106)
GFR calc Af Amer: 79 mL/min/{1.73_m2} (ref >59)
GFR, EST NON AFRICAN AMERICAN: 68 mL/min/{1.73_m2} (ref >59)
GLOBULIN, TOTAL: 3 g/dL (ref 1.5–4.5)
GLUCOSE: 99 mg/dL (ref 65–99)
Potassium: 4.2 mmol/L (ref 3.5–5.2)
Sodium: 141 mmol/L (ref 134–144)
Total Protein: 7.5 g/dL (ref 6.0–8.5)

## 2015-05-28 LAB — LIPID PANEL
CHOLESTEROL TOTAL: 220 mg/dL — AB (ref 100–199)
Chol/HDL Ratio: 4.2 ratio units (ref 0.0–4.4)
HDL: 53 mg/dL (ref >39)
LDL CALC: 133 mg/dL — AB (ref 0–99)
TRIGLYCERIDES: 169 mg/dL — AB (ref 0–149)
VLDL CHOLESTEROL CAL: 34 mg/dL (ref 5–40)

## 2015-05-28 LAB — TSH: TSH: 0.714 u[IU]/mL (ref 0.450–4.500)

## 2015-05-28 MED ORDER — PRAVASTATIN SODIUM 20 MG PO TABS: 20.0000 mg | ORAL_TABLET | Freq: Every day | ORAL | Status: DC

## 2015-05-28 NOTE — Telephone Encounter (Signed)
Recheck labs in 4 weeks. Thanks.

## 2015-05-28 NOTE — Telephone Encounter (Signed)
Yes  It could have. Thanks.

## 2015-05-28 NOTE — Telephone Encounter (Signed)
-----   Message from Lorie PhenixNancy Maloney, MD sent at 05/28/2015  7:08 AM EDT ----- Labs stable except for high cholesterol. In light of high blood pressure, cholesterol medication is recommended. Patient has not been good about taking medication, please see if would consider starting a statin. Thanks.

## 2015-05-28 NOTE — Telephone Encounter (Signed)
Tried calling patient and no answer. Will try again later.  

## 2015-05-28 NOTE — Telephone Encounter (Signed)
Can recheck fasting when she can, first. Thanks.

## 2015-05-28 NOTE — Telephone Encounter (Signed)
Advised Jasmin Roth of lab results. Jasmin Roth verbally acknowledges understanding. Jasmin Roth does agree to start statin. [facility]. Allene DillonEmily Drozdowski, CMA

## 2015-05-28 NOTE — Telephone Encounter (Signed)
Patient wants to know do you still want her to take Pravastatin being that her levels were "not a real reading"? Please advise. Thanks!

## 2015-05-28 NOTE — Telephone Encounter (Signed)
Patient called back wanting to let you know that she did not get a fasting specimen. Could this have affected her cholesterol results? Please advise. Thanks!

## 2015-06-05 NOTE — Telephone Encounter (Signed)
Advised patient as below. Patient reports that we can recheck a fasting lipid panel on her next F/U appt with Dr. Sherrie MustacheFisher.

## 2015-06-25 ENCOUNTER — Encounter: Payer: Medicare Other | Admitting: Family Medicine

## 2015-07-22 ENCOUNTER — Other Ambulatory Visit: Payer: Self-pay | Admitting: *Deleted

## 2015-07-22 DIAGNOSIS — M419 Scoliosis, unspecified: Secondary | ICD-10-CM

## 2015-07-22 MED ORDER — TRAMADOL HCL 50 MG PO TABS: 50.0000 mg | ORAL_TABLET | ORAL | Status: DC | PRN

## 2015-07-22 NOTE — Telephone Encounter (Signed)
Please call in tramadol.  

## 2015-07-22 NOTE — Telephone Encounter (Signed)
Rx called in to pharmacy. 

## 2015-08-20 NOTE — Telephone Encounter (Signed)
error 

## 2015-11-13 ENCOUNTER — Other Ambulatory Visit: Payer: Self-pay | Admitting: Family Medicine

## 2015-11-13 DIAGNOSIS — I10 Essential (primary) hypertension: Secondary | ICD-10-CM

## 2016-02-05 ENCOUNTER — Other Ambulatory Visit: Payer: Self-pay | Admitting: Family Medicine

## 2016-02-05 DIAGNOSIS — I10 Essential (primary) hypertension: Secondary | ICD-10-CM

## 2016-03-18 ENCOUNTER — Telehealth: Payer: Self-pay | Admitting: Family Medicine

## 2016-03-18 NOTE — Telephone Encounter (Signed)
Called Pt to schedule AWV with [facility] & CPE w/Jennifer- knb

## 2016-07-31 ENCOUNTER — Other Ambulatory Visit: Payer: Self-pay | Admitting: Family Medicine

## 2016-07-31 DIAGNOSIS — I10 Essential (primary) hypertension: Secondary | ICD-10-CM

## 2016-08-03 ENCOUNTER — Other Ambulatory Visit: Payer: Self-pay | Admitting: Family Medicine

## 2016-08-03 DIAGNOSIS — I10 Essential (primary) hypertension: Secondary | ICD-10-CM

## 2019-01-29 ENCOUNTER — Telehealth: Payer: Self-pay

## 2019-01-29 NOTE — Telephone Encounter (Signed)
Inetta Fermo from adult protective services called requesting to speak with Dr. Sherrie Mustache or his assistant. I took the phone call. Inetta Fermo states that patient is coming in to the office to see Dr. Sherrie Mustache tomorrow for a follow up. Ms. Naeve was a former patient of Dr. Elease Hashimoto, and has not been seen since 05/27/2015. Inetta Fermo states that Adult protective services had to get involved and make patient's daughter bring her in for an appointment. Inetta Fermo would like for Dr. Sherrie Mustache to speak with patient alone during tomorrows visit to assess capacity. Inetta Fermo states the daughter talks over the patient  whenever she is speaking to Ms. Delford Field. Adult protective service is trying to determine if they need to have  Guardianship, or whether patient just needs treatment service in the home.   Levander Campion Adult protective service call back [phone].

## 2019-01-30 ENCOUNTER — Ambulatory Visit (INDEPENDENT_AMBULATORY_CARE_PROVIDER_SITE_OTHER): Payer: Medicare Other | Admitting: Family Medicine

## 2019-01-30 ENCOUNTER — Other Ambulatory Visit: Payer: Self-pay

## 2019-01-30 ENCOUNTER — Encounter: Payer: Self-pay | Admitting: Family Medicine

## 2019-01-30 VITALS — BP 210/79 | HR 87 | Temp 97.6°F | Resp 16 | Wt 106.6 lb

## 2019-01-30 DIAGNOSIS — E785 Hyperlipidemia, unspecified: Secondary | ICD-10-CM | POA: Diagnosis not present

## 2019-01-30 DIAGNOSIS — F0390 Unspecified dementia without behavioral disturbance: Secondary | ICD-10-CM | POA: Diagnosis not present

## 2019-01-30 DIAGNOSIS — I10 Essential (primary) hypertension: Secondary | ICD-10-CM | POA: Diagnosis not present

## 2019-01-30 DIAGNOSIS — M810 Age-related osteoporosis without current pathological fracture: Secondary | ICD-10-CM

## 2019-01-30 MED ORDER — AMLODIPINE BESYLATE 2.5 MG PO TABS: 2.5000 mg | ORAL_TABLET | Freq: Every day | ORAL | 1 refills | Status: DC

## 2019-01-30 NOTE — Progress Notes (Signed)
Patient: Jasmin Roth Female    DOB: 1933-01-20   86 y.o.   MRN: [id number] Visit Date: 01/30/2019  Today's Provider: Mila Merry, MD   Chief Complaint  Patient presents with  . Hypertension  . Hyperlipidemia   Subjective:      HPI   This is a former of patient of Dr. Santiago Bur Last seen for in this practice in 2017 (seen by Dr. Elease Hashimoto). She was brought by her daughter today to re-establish. Patient has a long history of htn, high cholesterol and dementia. Her daughter Jasmin Roth has moved in with patient to be her full time caregiver. Her daughter states that the patient took it upon herself to cancel her follow up office visit over the years, and has been off of all her medications for at least the last year, however no medications have been refilled from this office since 2017. Patient reports that patient does ambulate within house with use of 4 prong walker.   Patient is pleasant, but not oriented to time or place. She denies any complaints, specifically denies any pain or shortness of breath. However she has great difficulty formulating an answer to most questions.     Lipid/Cholesterol, Follow-up:   Management changes since that visit include none; patient was advised to have repeat fasting labs. Last Lipid Panel:    Component Value Date/Time   CHOL 220 (H) 05/27/2015 1658   CHOL 133 08/30/2011 0403   TRIG 169 (H) 05/27/2015 1658   TRIG 78 08/30/2011 0403   HDL 53 05/27/2015 1658   HDL 47 08/30/2011 0403   CHOLHDL 4.2 05/27/2015 1658   VLDL 16 08/30/2011 0403   LDLCALC 133 (H) 05/27/2015 1658   LDLCALC 70 08/30/2011 0403    Risk factors for vascular disease include hypercholesterolemia  She reports poor compliance with treatment. Patient and daughter reports that she has been off medications since 2019. She is not having side effects.  Current symptoms include none and have been stable. Weight trend: decreasing steadily Prior visit with dietician:  no Current diet: not asked Current exercise: none  Wt Readings from Last 3 Encounters:  01/30/19 106 lb 9.6 oz (48.4 kg)  05/27/15 119 lb (54 kg)  08/03/13 115 lb (52.2 kg)    -------------------------------------------------------------------  Hypertension, follow-up:  BP Readings from Last 3 Encounters:  01/30/19 (!) 210/79  05/27/15 (!) 204/108  08/03/13 (!) 168/98    She was last seen for hypertension 3 years ago.  BP at that visit was 208/108. Management since that visit includes restarting Metoprolol and Clonidine. She reports poor compliance with treatment. She is not having side effects.  She is not exercising. She is adherent to low salt diet.   Outside blood pressures are not being checked. She is experiencing none.  Patient denies chest pain and lower extremity edema.   Cardiovascular risk factors include advanced age (older than 45 for men, 43 for women), dyslipidemia and hypertension.  Use of agents associated with hypertension: none.     Weight trend: decreasing steadily Wt Readings from Last 3 Encounters:  01/30/19 106 lb 9.6 oz (48.4 kg)  05/27/15 119 lb (54 kg)  08/03/13 115 lb (52.2 kg)    Current diet: in general, a "healthy" diet    ------------------------------------------------------------------------  No Known Allergies  No current outpatient medications on file.  Review of Systems  Constitutional: Positive for activity change. Negative for appetite change, chills, fatigue and fever.  Respiratory: Negative for chest tightness and  shortness of breath.   Cardiovascular: Negative for chest pain, palpitations and leg swelling.  Gastrointestinal: Negative for abdominal pain, nausea and vomiting.  Neurological: Negative for dizziness and weakness.  Psychiatric/Behavioral: Positive for confusion.    Social History   Tobacco Use  . Smoking status: Never Smoker  . Smokeless tobacco: Never Used  Substance Use Topics  . Alcohol use: No       Objective:   BP (!) 210/79 (BP Location: Left Arm, Patient Position: Sitting, Cuff Size: Normal)   Pulse 87   Temp 97.6 F (36.4 C) (Temporal)   Resp 16   Wt 106 lb 9.6 oz (48.4 kg)   BMI 23.90 kg/m  Vitals:   01/30/19 1037  BP: (!) 210/79  Pulse: 87  Resp: 16  Temp: 97.6 F (36.4 C)  TempSrc: Temporal  Weight: 106 lb 9.6 oz (48.4 kg)  Body mass index is 23.9 kg/m.   Physical Exam   General Appearance:    Well developed, well nourished female in no acute distress. Sitting comfortably in wheel chair with four-pronged walker at her side.   Eyes:    PERRL, conjunctiva/corneas clear, EOM's intact       Lungs:     Clear to auscultation bilaterally, respirations unlabored  Heart:    Normal heart rate. Normal rhythm. No murmurs, rubs, or gallops.   MS:   All extremities are intact.   Neurologic:   Awake, alert, oriented only to person. Cannot answer day of week, date, month, year or time of year. Does not remember the year she was born. She does remember the number of children she has, but does not recall all of their names. .        No results found for any visits on 01/30/19.     Assessment & Plan     Previous patient Dr. Sharyon Medicus never seen by me and not seen in this office since 2017 here to re-establish care at [facility].   1. Dementia without behavioral disturbance, unspecified dementia type ([place]) Fairly advanced and patient is clearly unable to care for herself.  - CBC - Vitamin B12 - TSH  2. Hyperlipidemia, unspecified hyperlipidemia type Off of all lipid medications for 2-3 years.  - Comprehensive metabolic panel - CBC - Lipid panel  3. Osteoporosis, post-menopausal   4. Essential hypertension previously on metoprolol on lisinopril which she has been out for the last few year. Her daughter states she had some swelling with amlodipine, which she took 5mg  a day of several years a go.  - amLODipine (NORVASC) 2.5 MG tablet; Take 1  tablet (2.5 mg total) by mouth daily.  Dispense: 90 tablet; Refill: 1  Anticipate adding a second agent if not improved at follow up in 3-4 weeks.   The entirety of the information documented in the History of Present Illness, Review of Systems and Physical Exam were personally obtained by me. Portions of this information were initially documented by Lynford Humphrey, CMA and reviewed by me for thoroughness and accuracy.      Lelon Huh, MD  [facility]

## 2019-01-30 NOTE — Patient Instructions (Signed)
.   Please review the attached list of medications and notify my office if there are any errors.   . Please bring all of your medications to every appointment so we can make sure that our medication list is the same as yours.   

## 2019-01-31 LAB — CBC
Hematocrit: 25.6 % — ABNORMAL LOW (ref 34.0–46.6)
Hemoglobin: 8.4 g/dL — ABNORMAL LOW (ref 11.1–15.9)
MCH: 25.7 pg — ABNORMAL LOW (ref 26.6–33.0)
MCHC: 32.8 g/dL (ref 31.5–35.7)
MCV: 78 fL — ABNORMAL LOW (ref 79–97)
Platelets: 213 10*3/uL (ref 150–450)
RBC: 3.27 x10E6/uL — ABNORMAL LOW (ref 3.77–5.28)
RDW: 21.9 % — ABNORMAL HIGH (ref 11.7–15.4)
WBC: 5.6 10*3/uL (ref 3.4–10.8)

## 2019-01-31 LAB — LIPID PANEL
Chol/HDL Ratio: 2.7 ratio (ref 0.0–4.4)
Cholesterol, Total: 176 mg/dL (ref 100–199)
HDL: 66 mg/dL (ref >39)
LDL Chol Calc (NIH): 97 mg/dL (ref 0–99)
Triglycerides: 71 mg/dL (ref 0–149)
VLDL Cholesterol Cal: 13 mg/dL (ref 5–40)

## 2019-01-31 LAB — COMPREHENSIVE METABOLIC PANEL
ALT: 7 IU/L (ref 0–32)
AST: 13 IU/L (ref 0–40)
Albumin/Globulin Ratio: 1.8 (ref 1.2–2.2)
Albumin: 4.4 g/dL (ref 3.6–4.6)
Alkaline Phosphatase: 65 IU/L (ref 39–117)
BUN/Creatinine Ratio: 20 (ref 12–28)
BUN: 15 mg/dL (ref 8–27)
Bilirubin Total: 1.2 mg/dL (ref 0.0–1.2)
CO2: 28 mmol/L (ref 20–29)
Calcium: 9.4 mg/dL (ref 8.7–10.3)
Chloride: 103 mmol/L (ref 96–106)
Creatinine, Ser: 0.76 mg/dL (ref 0.57–1.00)
GFR calc Af Amer: 84 mL/min/{1.73_m2} (ref >59)
GFR calc non Af Amer: 73 mL/min/{1.73_m2} (ref >59)
Globulin, Total: 2.5 g/dL (ref 1.5–4.5)
Glucose: 105 mg/dL — ABNORMAL HIGH (ref 65–99)
Potassium: 3 mmol/L — ABNORMAL LOW (ref 3.5–5.2)
Sodium: 143 mmol/L (ref 134–144)
Total Protein: 6.9 g/dL (ref 6.0–8.5)

## 2019-01-31 LAB — VITAMIN B12: Vitamin B-12: 482 pg/mL (ref 232–1245)

## 2019-01-31 LAB — TSH: TSH: 1.52 u[IU]/mL (ref 0.450–4.500)

## 2019-02-02 ENCOUNTER — Telehealth: Payer: Self-pay

## 2019-02-02 ENCOUNTER — Encounter: Payer: Self-pay | Admitting: Family Medicine

## 2019-02-02 DIAGNOSIS — E876 Hypokalemia: Secondary | ICD-10-CM

## 2019-02-02 LAB — FERRITIN: Ferritin: 57 ng/mL (ref 15–150)

## 2019-02-02 LAB — IRON AND TIBC
Iron Saturation: 5 % — CL (ref 15–55)
Iron: 17 ug/dL — ABNORMAL LOW (ref 27–139)
Total Iron Binding Capacity: 336 ug/dL (ref 250–450)
UIBC: 319 ug/dL (ref 118–369)

## 2019-02-02 LAB — SPECIMEN STATUS REPORT

## 2019-02-02 NOTE — Telephone Encounter (Signed)
Tried calling patient. No answer or voice  message system. Will try calling back later.  

## 2019-02-02 NOTE — Telephone Encounter (Signed)
-----   Message from Malva Limes, MD sent at 02/02/2019 11:03 AM EST ----- Potassium is very low. Hemoglobin and iron levels are very low. Need to start Kdur #30, rf x 3 and OTC iron sulfate 325 mg once a day.  Rest of labs are normal.  Follow up in February as scheduled.

## 2019-02-05 MED ORDER — POTASSIUM CHLORIDE CRYS ER 20 MEQ PO TBCR: 20.0000 meq | EXTENDED_RELEASE_TABLET | Freq: Every day | ORAL | 3 refills | Status: DC

## 2019-02-05 NOTE — Telephone Encounter (Signed)
Patient's daughter Westley Hummer advised and agrees with treatment plan. Prescription sent into pharmacy.

## 2019-02-05 NOTE — Telephone Encounter (Signed)
Tried calling patient. One number ([phone]) I reached a message stating "the number that you have dialed is no longer in service". I tried calling the second number listed in the chart [phone]), but didn't get an answer or voice message system. Unable to leave a message. Will try calling back at a later time.

## 2019-02-07 ENCOUNTER — Telehealth: Payer: Self-pay

## 2019-02-07 NOTE — Telephone Encounter (Signed)
Copied from CRM [id number]. Topic: General - Other >> Feb 07, 2019 11:57 AM Gwenlyn Fudge wrote: Reason for CRM: Levander Campion, from [facility], callign and is requesting to speak with DR. Fishers nurse regarding pts appt yesterday. Please advise .   [phone]

## 2019-02-09 NOTE — Telephone Encounter (Signed)
Spoke to Mrs. Levander Campion from [facility], to confirm appointment and medications.

## 2019-03-05 ENCOUNTER — Encounter: Payer: Self-pay | Admitting: Family Medicine

## 2019-03-05 ENCOUNTER — Other Ambulatory Visit: Payer: Self-pay

## 2019-03-05 ENCOUNTER — Ambulatory Visit (INDEPENDENT_AMBULATORY_CARE_PROVIDER_SITE_OTHER): Payer: Medicare Other | Admitting: Family Medicine

## 2019-03-05 VITALS — BP 168/82 | HR 93 | Temp 98.2°F | Ht <= 58 in | Wt 101.2 lb

## 2019-03-05 DIAGNOSIS — M810 Age-related osteoporosis without current pathological fracture: Secondary | ICD-10-CM

## 2019-03-05 DIAGNOSIS — I10 Essential (primary) hypertension: Secondary | ICD-10-CM | POA: Diagnosis not present

## 2019-03-05 DIAGNOSIS — E876 Hypokalemia: Secondary | ICD-10-CM | POA: Diagnosis not present

## 2019-03-05 DIAGNOSIS — D508 Other iron deficiency anemias: Secondary | ICD-10-CM

## 2019-03-05 NOTE — Patient Instructions (Signed)
.   Please review the attached list of medications and notify my office if there are any errors.   . Please bring all of your medications to every appointment so we can make sure that our medication list is the same as yours.    Potassium rich foods include bananas, oranges, cantaloupe, honeydew, apricots, grapefruit, avocados, dried fruits such as prunes, raisins, and dates. White beans such as navy (pea) beans, cannellini (white kidney) beans, great northern beans or lima beans. dark green leafy vegetables such as cooked spinach, cooked broccoli, sweet potatoes, mushrooms, peas, cucumbers, zucchini, eggplant

## 2019-03-05 NOTE — Progress Notes (Signed)
Patient: Jasmin Roth Female    DOB: [birth year]   86 y.o.   MRN: [id number] Visit Date: 03/05/2019  Today's Provider: Lelon Huh, MD   Chief Complaint  Patient presents with  . Hypertension  . Anemia  . Hypokalemia   Subjective:     HPI  Hypertension, follow-up:     BP Readings from Last 3 Encounters:  01/30/19 (!) 210/79  05/27/15 (!) 204/108  08/03/13 (!) 168/98    She was last seen for hypertension 1 month ago.  BP at that visit was 210/79. Management since that visit includes start Amlodipine 2.5 mg daily.  She reports  compliance with treatment. She is not having side effects.  She is not exercising. She is adherent to low salt diet.   Outside blood pressures are not being checked. She is experiencing none.  Patient denies chest pain and lower extremity edema.   Cardiovascular risk factors include advanced age (older than 52 for men, 79 for women), dyslipidemia and hypertension.  Use of agents associated with hypertension: none.                Weight trend: decreasing steadily    Wt Readings from Last 3 Encounters:  01/30/19 106 lb 9.6 oz (48.4 kg)  05/27/15 119 lb (54 kg)  08/03/13 115 lb (52.2 kg)    Current diet: in general, a "healthy" diet    ------------------------------------------------------------------------  Follow up for Hypokalemia:  The patient was last seen for this 1 month ago. Changes made at last visit include started kdur 1meq daily.  Lab Results  Component Value Date   NA 143 01/30/2019   K 3.0 (L) 01/30/2019   CL 103 01/30/2019   CO2 28 01/30/2019   GLUCOSE 105 (H) 01/30/2019   BUN 15 01/30/2019   CREATININE 0.76 01/30/2019   CALCIUM 9.4 01/30/2019   GFRNONAA 73 01/30/2019   GFRAA 84 01/30/2019    She reports good compliance with treatment. She feels that condition is Unchanged. She is not having side effects.    ------------------------------------------------------------------------------------  Follow up for Anemia:  The patient was last seen for this 1 month ago. Changes made at last visit include started OTC Iron Sulfate 325mg  daily. Lab Results  Component Value Date   WBC 5.6 01/30/2019   HGB 8.4 (L) 01/30/2019   HCT 25.6 (L) 01/30/2019   MCV 78 (L) 01/30/2019   PLT 213 01/30/2019   Lab Results  Component Value Date   IRON 17 (L) 01/30/2019   TIBC 336 01/30/2019   FERRITIN 57 01/30/2019    She reports good compliance with treatment. She feels that condition is Unchanged. She is not having side effects.   ------------------------------------------------------------------------------------  No Known Allergies   Current Outpatient Medications:  .  amLODipine (NORVASC) 2.5 MG tablet, Take 1 tablet (2.5 mg total) by mouth daily., Disp: 90 tablet, Rfl: 1 .  potassium chloride SA (KLOR-CON) 20 MEQ tablet, Take 1 tablet (20 mEq total) by mouth daily., Disp: 30 tablet, Rfl: 3  Review of Systems  Constitutional: Negative for appetite change, chills, fatigue and fever.  Respiratory: Negative for chest tightness and shortness of breath.   Cardiovascular: Negative for chest pain and palpitations.  Gastrointestinal: Negative for abdominal pain, nausea and vomiting.  Neurological: Negative for dizziness and weakness.    Social History   Tobacco Use  . Smoking status: Never Smoker  . Smokeless tobacco: Never Used  Substance Use Topics  . Alcohol use: No  Objective:   BP (!) 168/82 (BP Location: Right Arm, Patient Position: Sitting, Cuff Size: Normal)   Pulse 93   Temp 98.2 F (36.8 C) (Temporal)   Ht 4\' 8"  (1.422 m)   Wt 101 lb 3.2 oz (45.9 kg)   BMI 22.69 kg/m  Vitals:   03/05/19 1344  BP: (!) 168/82  Pulse: 93  Temp: 98.2 F (36.8 C)  TempSrc: Temporal  Weight: 101 lb 3.2 oz (45.9 kg)  Height: 4\' 8"  (1.422 m)  Body mass index is 22.69 kg/m.   Physical  Exam   General: Appearance:    Well developed, well nourished female sitting in wheelchair in no acute distress  Eyes:    PERRL, conjunctiva/corneas clear, EOM's intact       Lungs:     Clear to auscultation bilaterally, respirations unlabored  Heart:    Normal heart rate. Normal rhythm. No murmurs, rubs, or gallops.   MS:   All extremities are intact.   Neurologic:   Awake, alert, oriented x 3. No apparent focal neurological           defect.           Assessment & Plan    1. Essential hypertension Much better since starting low dose amlodipine, but not to goal. Consider increasing to 2.5mg  after reviewing labs.  - Comprehensive metabolic panel - Renal function panel  2. Other iron deficiency anemia Tolerating once daily iron supplement well with no adverse effects.  - CBC - Iron and TIBC  3. Hypokalemia Counseled on foods rich in potassium. Is having some trouble swallowing who potassium pill - Comprehensive metabolic panel - Renal function panel - Magnesium  4. Osteoporosis, post-menopausal  - VITAMIN D 25 Hydroxy (Vit-D Deficiency, Fractures)    The entirety of the information documented in the History of Present Illness, Review of Systems and Physical Exam were personally obtained by me. Portions of this information were initially documented by 05/05/19, CMA and reviewed by me for thoroughness and accuracy.   , MD  [facility]

## 2019-03-06 ENCOUNTER — Telehealth: Payer: Self-pay

## 2019-03-06 LAB — COMPREHENSIVE METABOLIC PANEL
ALT: 6 IU/L (ref 0–32)
AST: 15 IU/L (ref 0–40)
Albumin/Globulin Ratio: 1.7 (ref 1.2–2.2)
Albumin: 4.6 g/dL (ref 3.6–4.6)
Alkaline Phosphatase: 64 IU/L (ref 39–117)
BUN/Creatinine Ratio: 21 (ref 12–28)
BUN: 21 mg/dL (ref 8–27)
Bilirubin Total: 1.7 mg/dL — ABNORMAL HIGH (ref 0.0–1.2)
CO2: 20 mmol/L (ref 20–29)
Calcium: 10 mg/dL (ref 8.7–10.3)
Chloride: 104 mmol/L (ref 96–106)
Creatinine, Ser: 0.98 mg/dL (ref 0.57–1.00)
GFR calc Af Amer: 62 mL/min/{1.73_m2} (ref >59)
GFR calc non Af Amer: 54 mL/min/{1.73_m2} — ABNORMAL LOW (ref >59)
Globulin, Total: 2.7 g/dL (ref 1.5–4.5)
Glucose: 96 mg/dL (ref 65–99)
Potassium: 4.8 mmol/L (ref 3.5–5.2)
Sodium: 140 mmol/L (ref 134–144)
Total Protein: 7.3 g/dL (ref 6.0–8.5)

## 2019-03-06 LAB — CBC
Hematocrit: 32.8 % — ABNORMAL LOW (ref 34.0–46.6)
Hemoglobin: 10.1 g/dL — ABNORMAL LOW (ref 11.1–15.9)
MCH: 25.7 pg — ABNORMAL LOW (ref 26.6–33.0)
MCHC: 30.8 g/dL — ABNORMAL LOW (ref 31.5–35.7)
MCV: 84 fL (ref 79–97)
Platelets: 144 10*3/uL — ABNORMAL LOW (ref 150–450)
RBC: 3.93 x10E6/uL (ref 3.77–5.28)
RDW: 21.5 % — ABNORMAL HIGH (ref 11.7–15.4)
WBC: 4.6 10*3/uL (ref 3.4–10.8)

## 2019-03-06 LAB — MAGNESIUM: Magnesium: 2.2 mg/dL (ref 1.6–2.3)

## 2019-03-06 LAB — IRON AND TIBC
Iron Saturation: >95 % (ref 15–55)
Iron: 341 ug/dL (ref 27–139)
Total Iron Binding Capacity: <358 ug/dL (ref 250–450)
UIBC: <17 ug/dL — ABNORMAL LOW (ref 118–369)

## 2019-03-06 NOTE — Telephone Encounter (Signed)
-----   Message from Malva Limes, MD sent at 03/06/2019  7:52 AM EST ----- Please advise her daughter Rozanne Heumann. Potassium levels are much better. She could cut potassium supplement back to 1/2 tablet daily Is still anemic, but hemoglobin has improved from 8.4 to 10.1, normal is 11.1 Continue taking one iron tablet daily.   Will increase amlodipine to 5mg  daily by taking 2 of the 2.5mg  tablets daily. Will check on her in a few weeks and send in new prescription for 5mg  tablets if tolerating.

## 2019-03-06 NOTE — Telephone Encounter (Signed)
Patient's daughter Westley Hummer advised.

## 2019-03-22 ENCOUNTER — Telehealth: Payer: Self-pay | Admitting: Family Medicine

## 2019-03-22 DIAGNOSIS — I10 Essential (primary) hypertension: Secondary | ICD-10-CM

## 2019-03-22 NOTE — Telephone Encounter (Signed)
Attempted to contact patient, no answer or voicemail. 

## 2019-03-22 NOTE — Telephone Encounter (Signed)
Patient was seen on 03-05-2019 and amlodipine was doubled to 2 x 2.5mg  tablets daily. She should be about out of then, please see if she is tolerating that dose and we can send in prescription for 5mg  tablets.

## 2019-03-23 NOTE — Telephone Encounter (Signed)
Attempted to contact patient, no answer or voicemail. 

## 2019-03-27 MED ORDER — AMLODIPINE BESYLATE 2.5 MG PO TABS: 2.5000 mg | ORAL_TABLET | Freq: Every day | ORAL | 1 refills | Status: DC

## 2019-03-27 NOTE — Telephone Encounter (Signed)
Attempted to contact patient, no answer or voicemail. Okay for [facility] to advise patient and schedule follow up.

## 2019-03-27 NOTE — Telephone Encounter (Signed)
Patient is experiencing weakness and loss of appetite since  Doubling the Amlodipine. Daughter is wondering if patient's sodium could be low due to dose increase. Please advise.

## 2019-03-27 NOTE — Telephone Encounter (Signed)
It shouldn't affect her sodium, but it it could make her more tired. Let's just go back down to one 2.5mg  tablet for a little while. She should still have a refill. Schedule follow up in 2 months to see if BP goes back up to much.

## 2019-03-27 NOTE — Addendum Note (Signed)
Addended by: Sherre Poot on: 03/27/2019 01:13 PM   Modules accepted: Orders

## 2019-03-27 NOTE — Telephone Encounter (Signed)
RX sent to pharmacy  

## 2019-03-27 NOTE — Telephone Encounter (Signed)
That's fine. thanks

## 2019-03-27 NOTE — Telephone Encounter (Signed)
Pt's daughter stated that when pt's amlodipine was increased to 2 tablets this caused her to run out sooner. Pt is down to roughly 16 tablets and will need to refill earlier than normal. She wants to make sure the pharmacy will refill rx at that time since it will be earlier than regular refill date. Please advise.  They will start pt back down to one tablet and have scheduled 2 month follow up.

## 2019-04-30 ENCOUNTER — Telehealth: Payer: Self-pay

## 2019-04-30 DIAGNOSIS — F0390 Unspecified dementia without behavioral disturbance: Secondary | ICD-10-CM

## 2019-04-30 NOTE — Telephone Encounter (Signed)
Copied from CRM [id number]. Topic: General - Inquiry >> Apr 30, 2019  3:05 PM Deborha Payment wrote: Reason for CRM: Patient daughter called stating she believes patient needs a cognitive memory test done before may 25th appt. Patient daughter would like further info. Call back [phone]

## 2019-04-30 NOTE — Telephone Encounter (Signed)
Order place, she should get a call from referral coodinator this week. Call if not

## 2019-05-01 NOTE — Telephone Encounter (Signed)
Pt's daughter called back, she is requesting a text from the office that includes the number of the referral coordinator that will be contacting her. She says that she does not have VM set up on her phone and does not answer unknown calls to avoid being scammed. I advised patient to call back in the upcoming days that way we could provide her with the name/number for where the pt has been referred. I did not see it detailed at this time.

## 2019-05-01 NOTE — Telephone Encounter (Signed)
Tried calling patients daughter. No answer or voice message system. Phone just kept ringing. Will try calling back later.

## 2019-05-05 ENCOUNTER — Emergency Department
Admission: EM | Admit: 2019-05-05 | Discharge: 2019-05-05 | Disposition: A | Discharge status: Home / Self Care | Payer: Medicare Other | Attending: Emergency Medicine | Admitting: Emergency Medicine

## 2019-05-05 ENCOUNTER — Encounter: Payer: Self-pay | Admitting: Emergency Medicine

## 2019-05-05 ENCOUNTER — Other Ambulatory Visit: Payer: Self-pay

## 2019-05-05 DIAGNOSIS — Y92410 Unspecified street and highway as the place of occurrence of the external cause: Secondary | ICD-10-CM | POA: Insufficient documentation

## 2019-05-05 DIAGNOSIS — Y9389 Activity, other specified: Secondary | ICD-10-CM | POA: Insufficient documentation

## 2019-05-05 DIAGNOSIS — Z041 Encounter for examination and observation following transport accident: Secondary | ICD-10-CM | POA: Diagnosis not present

## 2019-05-05 DIAGNOSIS — Y999 Unspecified external cause status: Secondary | ICD-10-CM | POA: Insufficient documentation

## 2019-05-05 DIAGNOSIS — M859 Disorder of bone density and structure, unspecified: Secondary | ICD-10-CM | POA: Diagnosis not present

## 2019-05-05 HISTORY — DX: Essential (primary) hypertension: I10
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
## 2019-05-16 ENCOUNTER — Telehealth: Payer: Self-pay | Admitting: Family Medicine

## 2019-05-16 NOTE — Telephone Encounter (Signed)
Patients daughter called and stated she will schedule to awv on 5/25 during her doctors visit.

## 2019-05-16 NOTE — Telephone Encounter (Signed)
Attempted to schedule AWV. Unable to LVM.  Will try at later time. No voicemail. 

## 2019-05-25 ENCOUNTER — Telehealth: Payer: Self-pay

## 2019-05-25 NOTE — Telephone Encounter (Signed)
Copied from CRM [id number]. Topic: General - Other >> May 25, 2019  1:30 PM Worthy Keeler D wrote: Reason for CRM: Diona Fanti with [facility] Social Work would like to speak with Dr Sherrie Mustache or his nurse regarding a open case, her best contact number is [phone]

## 2019-05-28 NOTE — Telephone Encounter (Signed)
Jasmin Roth- social worker advised per Dr. Theodis Aguas message. She is requesting a MMSE done at patient's visit tomorrow. She is also requesting a letter from Dr. Sherrie Mustache stating patient has dementia and unable to make her own decisions. She would like the two faxed to  Fax # [phone]. Verlon Au will sent a medical record release today.

## 2019-05-28 NOTE — Telephone Encounter (Signed)
Tried calling [place] at number listed below. No answer or voice message system. Unable to leave message; phone just kept ringing. Stephens Shire calls back OK for [facility] Triage to speak with Jasmin Roth to find out what this is in regards to.

## 2019-05-28 NOTE — Telephone Encounter (Signed)
started on 2.5mg  amolodipine on 01/30/2019.  increased to 2 tablets a day on 03/05/2019 called on 03-22-2019 due to patient having weakness si was advised to go back down to 1 tablet daily and is scheduled for follow up appt tomorrow. they have not missed any apptointments that i am aware of since she first saw me in january.  the patient has dementia and is not able to handle her own affairs.

## 2019-05-28 NOTE — Telephone Encounter (Addendum)
Please review social worker's concerns and questions below and advise.

## 2019-05-28 NOTE — Telephone Encounter (Signed)
Diona Fanti returning call, requesting to speak with clinical staff regarding patient. Verlon Au would not disclose additional information other than it is regarding an open case.
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
## 2019-05-28 NOTE — Telephone Encounter (Signed)
Ledon Snare, social worker called with multipe issues that she would like to address.she says that she has an open case file on the pt: 1). Medication count of Amlodipine 2.5 mg is off (pill count low, approximately <10 tablets); she states the pt's daugther states the MD changed the medication from 1 daily pills to 2 tablets, then back to 1 tablet because the pt was groggy. She would like to know dates/amounts of refills and medication changes 2.) Their records indicate that the pt has not been coming to appts, but she is scheduled to be seen on 05/29/19 at 1340; she would like to know how long the pt hahseen seen in? 3.) They are considering guardianship of the pt due to her competency; she says the pt scored poorly on the mental evals that were completed by DSS; she would like to know what the pt's PCP thinks The pt sees Dr Sherrie Mustache, [facility]; spoke with Joni Reining, and she states the CMAs are with patients; Ms Yetta Barre notified, and can be contacted at [phone]; will route to office for final disposition.

## 2019-05-29 ENCOUNTER — Other Ambulatory Visit: Payer: Self-pay

## 2019-05-29 ENCOUNTER — Other Ambulatory Visit: Payer: Self-pay | Admitting: Family Medicine

## 2019-05-29 ENCOUNTER — Ambulatory Visit (INDEPENDENT_AMBULATORY_CARE_PROVIDER_SITE_OTHER): Payer: Medicare Other | Admitting: Family Medicine

## 2019-05-29 ENCOUNTER — Encounter: Payer: Self-pay | Admitting: Family Medicine

## 2019-05-29 VITALS — BP 168/78 | HR 91 | Temp 97.3°F | Resp 16 | Wt 104.0 lb

## 2019-05-29 DIAGNOSIS — I10 Essential (primary) hypertension: Secondary | ICD-10-CM

## 2019-05-29 DIAGNOSIS — F0390 Unspecified dementia without behavioral disturbance: Secondary | ICD-10-CM

## 2019-05-29 DIAGNOSIS — E876 Hypokalemia: Secondary | ICD-10-CM

## 2019-05-29 DIAGNOSIS — D509 Iron deficiency anemia, unspecified: Secondary | ICD-10-CM | POA: Diagnosis not present

## 2019-05-29 DIAGNOSIS — D696 Thrombocytopenia, unspecified: Secondary | ICD-10-CM

## 2019-05-29 MED ORDER — POTASSIUM CHLORIDE CRYS ER 20 MEQ PO TBCR: 10.0000 meq | EXTENDED_RELEASE_TABLET | Freq: Every day | ORAL | Status: DC

## 2019-05-29 MED ORDER — DONEPEZIL HCL 5 MG PO TABS: 5.0000 mg | ORAL_TABLET | Freq: Every day | ORAL | 4 refills | Status: DC

## 2019-05-29 NOTE — Patient Instructions (Signed)
.   Please review the attached list of medications and notify my office if there are any errors.   . You can cut potassium tablets down to 1/2 tablet daily

## 2019-05-29 NOTE — Telephone Encounter (Signed)
Requested medication (s) are due for refill today: Yes  Requested medication (s) are on the active medication list: Yes  Last refill:  05/29/19  Future visit scheduled: Yes  Notes to clinic:  Provider did not specify amount. Pharmacy requesting 90 day supply.    Requested Prescriptions  Pending Prescriptions Disp Refills   potassium chloride SA (KLOR-CON) 20 MEQ tablet [Pharmacy Med Name: POTASSIUM CHLORIDE CRYS ER 20 MEQ T] 30 tablet     Sig: TAKE 1 TABLET BY MOUTH ONCE DAILY      Endocrinology:  Minerals - Potassium Supplementation Passed - 05/29/2019  3:37 PM      Passed - K in normal range and within 360 days    Potassium  Date Value Ref Range Status  03/05/2019 4.8 3.5 - 5.2 mmol/L Final  09/01/2011 3.8 3.5 - 5.1 mmol/L Final          Passed - Cr in normal range and within 360 days    Creatinine  Date Value Ref Range Status  09/01/2011 0.66 0.60 - 1.30 mg/dL Final   Creatinine, Ser  Date Value Ref Range Status  03/05/2019 0.98 0.57 - 1.00 mg/dL Final          Passed - Valid encounter within last 12 months    Recent Outpatient Visits           Today Essential hypertension   [facility] Malva Limes, MD   2 months ago Essential hypertension   [facility] Malva Limes, MD   3 months ago Dementia without behavioral disturbance, unspecified dementia type [facility])   [facility] Malva Limes, MD   4 years ago Anxiety   [facility] Lorie Phenix, MD       Future Appointments             In 1 month Fisher, Demetrios Isaacs, MD [facility], [facility]

## 2019-05-30 ENCOUNTER — Encounter: Payer: Self-pay | Admitting: Family Medicine

## 2019-05-30 ENCOUNTER — Telehealth: Payer: Self-pay | Admitting: Family Medicine

## 2019-05-30 ENCOUNTER — Telehealth: Payer: Self-pay

## 2019-05-30 ENCOUNTER — Other Ambulatory Visit: Payer: Self-pay

## 2019-05-30 DIAGNOSIS — D696 Thrombocytopenia, unspecified: Secondary | ICD-10-CM | POA: Insufficient documentation

## 2019-05-30 LAB — FERRITIN: Ferritin: 344 ng/mL — ABNORMAL HIGH (ref 15–150)

## 2019-05-30 LAB — RENAL FUNCTION PANEL
Albumin: 4.4 g/dL (ref 3.6–4.6)
BUN/Creatinine Ratio: 31 — ABNORMAL HIGH (ref 12–28)
BUN: 24 mg/dL (ref 8–27)
CO2: 22 mmol/L (ref 20–29)
Calcium: 9.5 mg/dL (ref 8.7–10.3)
Chloride: 108 mmol/L — ABNORMAL HIGH (ref 96–106)
Creatinine, Ser: 0.78 mg/dL (ref 0.57–1.00)
GFR calc Af Amer: 81 mL/min/{1.73_m2} (ref >59)
GFR calc non Af Amer: 71 mL/min/{1.73_m2} (ref >59)
Glucose: 94 mg/dL (ref 65–99)
Phosphorus: 2.5 mg/dL — ABNORMAL LOW (ref 3.0–4.3)
Potassium: 3.9 mmol/L (ref 3.5–5.2)
Sodium: 141 mmol/L (ref 134–144)

## 2019-05-30 LAB — CBC
Hematocrit: 30.9 % — ABNORMAL LOW (ref 34.0–46.6)
Hemoglobin: 10.3 g/dL — ABNORMAL LOW (ref 11.1–15.9)
MCH: 28 pg (ref 26.6–33.0)
MCHC: 33.3 g/dL (ref 31.5–35.7)
MCV: 84 fL (ref 79–97)
Platelets: 133 10*3/uL — ABNORMAL LOW (ref 150–450)
RBC: 3.68 x10E6/uL — ABNORMAL LOW (ref 3.77–5.28)
RDW: 19.3 % — ABNORMAL HIGH (ref 11.7–15.4)
WBC: 4 10*3/uL (ref 3.4–10.8)

## 2019-05-30 MED ORDER — VALSARTAN 80 MG PO TABS
80.0000 mg | ORAL_TABLET | Freq: Every day | ORAL | 1 refills | Status: DC
Start: 2019-05-30 — End: 2019-07-16

## 2019-05-30 NOTE — Telephone Encounter (Signed)
Attempted to contact patient's daughter Westley Hummer, no answer or voicemail. Okay for [facility] to advise patient's daughter.

## 2019-05-30 NOTE — Telephone Encounter (Signed)
Copied from CRM [id number]. Topic: General - Other >> May 30, 2019  3:57 PM Jaquita Rector A wrote: Reason for CRM: Clearence Cheek with the department of Social Services called to speak to the nurse on behalf of the appointment that patient had on 05/29/19. Please call Servando Snare at  Ph# [phone]

## 2019-05-30 NOTE — Telephone Encounter (Signed)
-----   Message from Malva Limes, MD sent at 05/30/2019  7:32 AM EDT ----- Iron levels are much better. Potassium levels are good. Continue taking 1/2 tablet of potassium daily. Add valsartan 80mg  once a day, #30, rf x 1 for better blood pressure control.  Please schedule follow up for BP check in 6 weeks.  Patient daughter would like printed copy of result note to give to the social worker.

## 2019-05-30 NOTE — Telephone Encounter (Signed)
Pt's daughter returned a missed call regarding her mother's lab results . Please call back at [phone]

## 2019-05-30 NOTE — Telephone Encounter (Signed)
Requested medication (s) are due for refill today: yes  Requested medication (s) are on the active medication list: no  Last refill:  ?  Future visit scheduled: yes  Notes to clinic:  not on active med list    Requested Prescriptions  Pending Prescriptions Disp Refills   valsartan (DIOVAN) 80 MG tablet 30 tablet 1    Sig: Take 1 tablet (80 mg total) by mouth daily.      Cardiovascular:  Angiotensin Receptor Blockers Failed - 05/30/2019  8:59 AM      Failed - Last BP in normal range    BP Readings from Last 1 Encounters:  05/29/19 (!) 168/78          Passed - Cr in normal range and within 180 days    Creatinine  Date Value Ref Range Status  09/01/2011 0.66 0.60 - 1.30 mg/dL Final   Creatinine, Ser  Date Value Ref Range Status  05/29/2019 0.78 0.57 - 1.00 mg/dL Final          Passed - K in normal range and within 180 days    Potassium  Date Value Ref Range Status  05/29/2019 3.9 3.5 - 5.2 mmol/L Final  09/01/2011 3.8 3.5 - 5.1 mmol/L Final          Passed - Patient is not pregnant      Passed - Valid encounter within last 6 months    Recent Outpatient Visits           Yesterday Essential hypertension   [facility] Malva Limes, MD   2 months ago Essential hypertension   [facility] Malva Limes, MD   4 months ago Dementia without behavioral disturbance, unspecified dementia type [facility])   [facility] Malva Limes, MD   4 years ago Anxiety   [facility] Lorie Phenix, MD       Future Appointments             In 1 month Fisher, Demetrios Isaacs, MD [facility], [facility]

## 2019-05-31 ENCOUNTER — Telehealth: Payer: Self-pay

## 2019-05-31 ENCOUNTER — Telehealth: Payer: Self-pay | Admitting: Family Medicine

## 2019-05-31 NOTE — Telephone Encounter (Signed)
Tried calling [name] back. No answer. Will try calling back later. OK for Jasmin Roth triage to advise [name] of results and  schedule 6 week BP follow up.  Please route message back to the office after she has been advised.

## 2019-05-31 NOTE — Telephone Encounter (Signed)
Sylvie Farrier, Patient's daughter is calling to receive the patient's lab results. Please advise Cb- [phone]

## 2019-05-31 NOTE — Telephone Encounter (Signed)
No, she is not too old to get the covid vaccine. There is no upper age limit.

## 2019-05-31 NOTE — Telephone Encounter (Signed)
Malva Limes, MD  05/30/2019 7:32 AM EDT    Iron levels are much better. Potassium levels are good. Continue taking 1/2 tablet of potassium daily. Add valsartan 80mg  once a day, #30, rf x 1 for better blood pressure control.  Please schedule follow up for BP check in 6 weeks.  Patient daughter would like printed copy of result note to give to the social worker.

## 2019-05-31 NOTE — Telephone Encounter (Signed)
Pt's daughter called back for results, verbalizes understanding. Appt had been made for BP follow up for 07/17/2019. Would like copy of results mailed to her home address.

## 2019-05-31 NOTE — Telephone Encounter (Signed)
Patient's Daughter tried to call Roshena back for lab results. CB- [phone]

## 2019-05-31 NOTE — Telephone Encounter (Signed)
Tried calling patient's daughter Westley Hummer to give lab results. No answer or voice message system. Will try calling back later.

## 2019-05-31 NOTE — Telephone Encounter (Signed)
Copied from CRM [id number]. Topic: General - Other >> May 31, 2019  2:41 PM Jaquita Rector A wrote: Reason for CRM: Philippa Sicks to speak to someone about her mom the patient. Asking if Dr Sherrie Mustache think patient is too old to get the covid vaccine. Asking for a call back at Ph# [phone]

## 2019-05-31 NOTE — Telephone Encounter (Signed)
I called patient's daughter back and advised her that lab results would have to be pick up from our office. [name] verbalized understanding and states she will have [place] print a copy during the office visit.   [name] wanted to let Dr. Sherrie Mustache know that patient took the 1st dose of Donepezil last night, and this morning patient woke up feeling slightly dizzy. The dizziness didn't last long. [name] wants to know if this is a side effect of that medication. Patient took the medication last night around 10pm.

## 2019-05-31 NOTE — Telephone Encounter (Signed)
Patient's daughter Jasmin Roth is not listed on patient's DPR. I called [name] back and advised her of this.

## 2019-06-01 NOTE — Telephone Encounter (Signed)
Attempted to contact patient's daughter Westley Hummer, no answer or voicemail. Okay for [facility] to advise [employer]

## 2019-06-01 NOTE — Telephone Encounter (Signed)
Patients daughter called and she was read message by Dr Sherrie Mustache.  She verbalized understanding

## 2019-06-01 NOTE — Telephone Encounter (Signed)
Its not an unusual side effect but it usually improves or goes away after taking the medication of a week or two.

## 2019-07-11 ENCOUNTER — Telehealth: Payer: Self-pay

## 2019-07-11 NOTE — Telephone Encounter (Signed)
Copied from CRM [id number]. Topic: General - Call Back - No Documentation >> Jul 11, 2019  3:10 PM Randol Kern wrote: Best contact: [phone] Jasmin Roth called reporting that she missed a call from the office. Please advise

## 2019-07-16 ENCOUNTER — Ambulatory Visit (INDEPENDENT_AMBULATORY_CARE_PROVIDER_SITE_OTHER): Payer: Medicare Other | Admitting: Family Medicine

## 2019-07-16 ENCOUNTER — Other Ambulatory Visit: Payer: Self-pay

## 2019-07-16 ENCOUNTER — Encounter: Payer: Self-pay | Admitting: Family Medicine

## 2019-07-16 VITALS — BP 130/58 | HR 95 | Temp 97.6°F | Resp 18 | Wt 103.0 lb

## 2019-07-16 DIAGNOSIS — I10 Essential (primary) hypertension: Secondary | ICD-10-CM

## 2019-07-16 DIAGNOSIS — F0390 Unspecified dementia without behavioral disturbance: Secondary | ICD-10-CM

## 2019-07-16 DIAGNOSIS — M810 Age-related osteoporosis without current pathological fracture: Secondary | ICD-10-CM | POA: Diagnosis not present

## 2019-07-16 MED ORDER — VALSARTAN 40 MG PO TABS: 40.0000 mg | ORAL_TABLET | Freq: Every day | ORAL | 1 refills | Status: DC

## 2019-07-16 NOTE — Progress Notes (Signed)
I,Roshena L Chambers,acting as a scribe for Mila Merry, MD.,have documented all relevant documentation on the behalf of Mila Merry, MD,as directed by  Mila Merry, MD while in the presence of Mila Merry, MD.  Established patient visit   Patient: Jasmin Roth   DOB: 1933/06/04   86 y.o. Female  MRN: [id number] Visit Date: 07/16/2019  Today's healthcare provider: Mila Merry, MD   Chief Complaint  Patient presents with  . Hypertension  . Dementia   Subjective    HPI  Hypertension, follow-up  BP Readings from Last 3 Encounters:  07/16/19 (!) 130/58  05/29/19 (!) 168/78  05/05/19 (!) 166/94   Wt Readings from Last 3 Encounters:  07/16/19 103 lb (46.7 kg)  05/29/19 104 lb (47.2 kg)  05/05/19 103 lb (46.7 kg)     She was last seen for hypertension 6 weeks ago.  BP at that visit was 168/78. Management since that visit includes adding valsartan 80mg  once a day for better blood pressure control.  She reports good compliance with treatment. She is having side effects. Has had loose/ diarrhea like stools since last office visit. She has also been sleeping more and feeling drowsy.  She is following a Regular diet. She is not exercising. She does not smoke.  Use of agents associated with hypertension: none.   Outside blood pressures are not checked. Symptoms: No chest pain No chest pressure  No palpitations No syncope  Yes dyspnea No orthopnea  No paroxysmal nocturnal dyspnea No lower extremity edema   Pertinent labs: Lab Results  Component Value Date   CHOL 176 01/30/2019   HDL 66 01/30/2019   LDLCALC 97 01/30/2019   TRIG 71 01/30/2019   CHOLHDL 2.7 01/30/2019   Lab Results  Component Value Date   NA 141 05/29/2019   K 3.9 05/29/2019   CREATININE 0.78 05/29/2019   GFRNONAA 71 05/29/2019   GFRAA 81 05/29/2019   GLUCOSE 94 05/29/2019     The ASCVD Risk score (Goff DC Jr., et al., 2013) failed to calculate for the following reasons:   The 2013  ASCVD risk score is only valid for ages 69 to 65   ---------------------------------------------------------------------------------------------------  Follow up for Dementia:  The patient was last seen for this 6 weeks ago. Changes made at last visit include starting donepezil (ARICEPT) 5 MG tablet at bedtime.  She reports good compliance with treatment. She feels that condition is Unchanged. Her daughter states her memory seems a little better in the morning on some days, but most days are about the same as before.  She is having side effects. Has had loose/ diarrhea like stools since last office visit. Sometimes she is not able top make it to the restroom and she has bowel movement accidents on herself.  She has also been sleeping more and feeling drowsy.   -----------------------------------------------------------------------------------------    Medications: Outpatient Medications Prior to Visit  Medication Sig  . amLODipine (NORVASC) 2.5 MG tablet Take 1 tablet (2.5 mg total) by mouth daily.  76 donepezil (ARICEPT) 5 MG tablet Take 1 tablet (5 mg total) by mouth at bedtime.  . ferrous sulfate 325 (65 FE) MG EC tablet Take 325 mg by mouth daily.   . potassium chloride SA (KLOR-CON) 20 MEQ tablet TAKE 1 TABLET BY MOUTH ONCE DAILY (Patient taking differently: Take 10 mEq by mouth daily. )  . [DISCONTINUED] valsartan (DIOVAN) 80 MG tablet Take 1 tablet (80 mg total) by mouth daily.   No facility-administered medications  prior to visit.    Review of Systems  Constitutional: Negative for appetite change, chills, fatigue and fever.  Respiratory: Positive for shortness of breath. Negative for chest tightness.   Cardiovascular: Negative for chest pain and palpitations.  Gastrointestinal: Positive for diarrhea (loose stools ). Negative for abdominal pain, nausea and vomiting.  Neurological: Positive for dizziness. Negative for weakness.  Psychiatric/Behavioral:       Hypersomnia      Objective    BP (!) 130/58 (BP Location: Left Arm, Patient Position: Sitting, Cuff Size: Normal)   Pulse 95   Temp 97.6 F (36.4 C) (Temporal)   Resp 18   Wt 103 lb (46.7 kg)   SpO2 95% Comment: room air  BMI 23.09 kg/m   Physical Exam   General appearance: Well developed, well nourished female, cooperative and in no acute distress Head: Normocephalic, without obvious abnormality, atraumatic Respiratory: Respirations even and unlabored, normal respiratory rate Extremities: All extremities are intact.  Skin: Skin color, texture, turgor normal. No rashes seen  Psych: Appropriate mood and affect. Neurologic: Mental status: Awake and alert. Oriented to person.   No results found for any visits on 07/16/19.  Assessment & Plan     1. Essential hypertension Doing well with initiation of valsartan, but diastolic BP is a little too how. Will reduce valsartan as below.  - Comprehensive metabolic panel - valsartan (DIOVAN) 40 MG tablet; Take 1 tablet (40 mg total) by mouth daily.  Dispense: 30 tablet; Refill: 1 Consider putting potassium supplement on hold if potassium level is in high normal range.   2. Osteoporosis, post-menopausal  - VITAMIN D 25 Hydroxy (Vit-D Deficiency, Fractures)  3. Dementia without behavioral disturbance, unspecified dementia type ([facility]) Stable since starting donepezil. May be causing some her daytime sedation. Continue low dose donepezil for the time being.   Follow up in 3 months    No follow-ups on file.      The entirety of the information documented in the History of Present Illness, Review of Systems and Physical Exam were personally obtained by me. Portions of this information were initially documented by the CMA and reviewed by me for thoroughness and accuracy.      Mila Merry, MD  [facility] [phone] (phone) [phone] (fax)  [facility]

## 2019-07-16 NOTE — Patient Instructions (Signed)
.   We are changing the dose of valsartan from 80mg  to 40mg  once a day.   . Please bring all of your medications to every appointment so we can make sure that our medication list is the same as yours.

## 2019-07-17 ENCOUNTER — Telehealth: Payer: Self-pay

## 2019-07-17 ENCOUNTER — Ambulatory Visit: Payer: Self-pay | Admitting: Family Medicine

## 2019-07-17 ENCOUNTER — Encounter: Payer: Self-pay | Admitting: Family Medicine

## 2019-07-17 DIAGNOSIS — R739 Hyperglycemia, unspecified: Secondary | ICD-10-CM | POA: Insufficient documentation

## 2019-07-17 DIAGNOSIS — E559 Vitamin D deficiency, unspecified: Secondary | ICD-10-CM | POA: Insufficient documentation

## 2019-07-17 LAB — COMPREHENSIVE METABOLIC PANEL
ALT: 9 IU/L (ref 0–32)
AST: 17 IU/L (ref 0–40)
Albumin/Globulin Ratio: 1.6 (ref 1.2–2.2)
Albumin: 4.4 g/dL (ref 3.6–4.6)
Alkaline Phosphatase: 72 IU/L (ref 48–121)
BUN/Creatinine Ratio: 22 (ref 12–28)
BUN: 20 mg/dL (ref 8–27)
Bilirubin Total: 0.7 mg/dL (ref 0.0–1.2)
CO2: 22 mmol/L (ref 20–29)
Calcium: 9.7 mg/dL (ref 8.7–10.3)
Chloride: 104 mmol/L (ref 96–106)
Creatinine, Ser: 0.92 mg/dL (ref 0.57–1.00)
GFR calc Af Amer: 67 mL/min/{1.73_m2} (ref >59)
GFR calc non Af Amer: 58 mL/min/{1.73_m2} — ABNORMAL LOW (ref >59)
Globulin, Total: 2.8 g/dL (ref 1.5–4.5)
Glucose: 147 mg/dL — ABNORMAL HIGH (ref 65–99)
Potassium: 3.4 mmol/L — ABNORMAL LOW (ref 3.5–5.2)
Sodium: 142 mmol/L (ref 134–144)
Total Protein: 7.2 g/dL (ref 6.0–8.5)

## 2019-07-17 LAB — VITAMIN D 25 HYDROXY (VIT D DEFICIENCY, FRACTURES): Vit D, 25-Hydroxy: 4 ng/mL — ABNORMAL LOW (ref 30.0–100.0)

## 2019-07-17 NOTE — Telephone Encounter (Signed)
Patient's daughter Westley Hummer advised. She verbalized understanding.

## 2019-07-17 NOTE — Telephone Encounter (Signed)
Poke with patient's daughter Jasmin Roth she had further questions regarding dosage of vitamin D3 and potassium supplements.

## 2019-07-17 NOTE — Patient Instructions (Signed)
.   Please review the attached list of medications and notify my office if there are any errors.   . Please bring all of your medications to every appointment so we can make sure that our medication list is the same as yours.   

## 2019-07-17 NOTE — Telephone Encounter (Signed)
-----   Message from Malva Limes, MD sent at 07/17/2019  7:59 AM EDT ----- Potassium is a little low, she needs to stay on the potassium supplement.  Her blood sugar is high, she needs to cut back on sweets and sugary drinks such as tea. She also has very low vitamin D levels. She needs to take OTC vitamin D3 1000 units twice a day.  Follow up in October as scheduled.

## 2019-07-17 NOTE — Telephone Encounter (Signed)
Copied from CRM [id number]. Topic: General - Other >> Jul 17, 2019  9:38 AM Dalphine Handing A wrote: Patient daughter Westley Hummer wants callback in regards to lab results from [facility]

## 2019-09-19 ENCOUNTER — Other Ambulatory Visit: Payer: Self-pay | Admitting: Family Medicine

## 2019-09-19 DIAGNOSIS — I10 Essential (primary) hypertension: Secondary | ICD-10-CM

## 2019-10-08 ENCOUNTER — Other Ambulatory Visit: Payer: Self-pay | Admitting: Family Medicine

## 2019-10-08 ENCOUNTER — Telehealth: Payer: Self-pay | Admitting: Family Medicine

## 2019-10-08 DIAGNOSIS — I10 Essential (primary) hypertension: Secondary | ICD-10-CM

## 2019-10-08 NOTE — Telephone Encounter (Signed)
Pts daughter is calling in to see if Sherrie Mustache would like pt to continue taking medication listed below . Please advise     amLODipine (NORVASC) 2.5 MG tablet [[id number]]

## 2019-10-12 ENCOUNTER — Telehealth: Payer: Self-pay

## 2019-10-12 NOTE — Telephone Encounter (Signed)
No message in chart were anyone was trying to call patient.

## 2019-10-12 NOTE — Telephone Encounter (Signed)
Copied from CRM [id number]. Topic: General - Call Back - No Documentation >> Oct 11, 2019  3:56 PM Randol Kern wrote: Reason for CRM: Pt's daughter returned call to the office, charlene. States there were two missed calls, and that she has already confirmed appt for next week.   Best contact: [phone]

## 2019-10-16 ENCOUNTER — Encounter: Payer: Self-pay | Admitting: Family Medicine

## 2019-10-16 ENCOUNTER — Other Ambulatory Visit: Payer: Self-pay

## 2019-10-16 ENCOUNTER — Ambulatory Visit (INDEPENDENT_AMBULATORY_CARE_PROVIDER_SITE_OTHER): Payer: Medicare Other | Admitting: Family Medicine

## 2019-10-16 VITALS — BP 162/80 | HR 87 | Temp 98.2°F | Ht <= 58 in | Wt 108.8 lb

## 2019-10-16 DIAGNOSIS — R739 Hyperglycemia, unspecified: Secondary | ICD-10-CM | POA: Diagnosis not present

## 2019-10-16 DIAGNOSIS — E559 Vitamin D deficiency, unspecified: Secondary | ICD-10-CM

## 2019-10-16 DIAGNOSIS — D696 Thrombocytopenia, unspecified: Secondary | ICD-10-CM | POA: Diagnosis not present

## 2019-10-16 DIAGNOSIS — I10 Essential (primary) hypertension: Secondary | ICD-10-CM | POA: Diagnosis not present

## 2019-10-16 DIAGNOSIS — F0390 Unspecified dementia without behavioral disturbance: Secondary | ICD-10-CM

## 2019-10-16 DIAGNOSIS — Z23 Encounter for immunization: Secondary | ICD-10-CM

## 2019-10-16 NOTE — Progress Notes (Signed)
Established patient visit   Patient: Jasmin Roth   DOB: 1933/08/17   86 y.o. Female  MRN: [id number] Visit Date: 10/16/2019  Today's healthcare provider: Mila Merry, MD   Chief Complaint  Patient presents with  . Hypertension  . Vitamin D deficiency   Subjective    HPI  Hypertension, follow-up  BP Readings from Last 3 Encounters:  10/16/19 (!) 176/75  07/16/19 (!) 130/58  05/29/19 (!) 168/78   Wt Readings from Last 3 Encounters:  10/16/19 108 lb 12.8 oz (49.4 kg)  07/16/19 103 lb (46.7 kg)  05/29/19 104 lb (47.2 kg)     She was last seen for hypertension 3 months ago.  BP at that visit was 130/58. Management since that visit includes reduced Valsartan to 40 mg .  She reports good compliance with treatment. She is not having side effects.  She is following a Regular diet. She is not exercising. She does not smoke.  Use of agents associated with hypertension: none.   Outside blood pressures are not being checked at home. Symptoms: No chest pain No chest pressure  No palpitations No syncope  No dyspnea No orthopnea  No paroxysmal nocturnal dyspnea No lower extremity edema   Pertinent labs: Lab Results  Component Value Date   CHOL 176 01/30/2019   HDL 66 01/30/2019   LDLCALC 97 01/30/2019   TRIG 71 01/30/2019   CHOLHDL 2.7 01/30/2019   Lab Results  Component Value Date   NA 142 07/16/2019   K 3.4 (L) 07/16/2019   CREATININE 0.92 07/16/2019   GFRNONAA 58 (L) 07/16/2019   GFRAA 67 07/16/2019   GLUCOSE 147 (H) 07/16/2019     The ASCVD Risk score (Goff DC Jr., et al., 2013) failed to calculate for the following reasons:   The 2013 ASCVD risk score is only valid for ages 7 to 38   --------------------------------------------------------------------------------------------------- Vitamin D deficiency, follow-up  Lab Results  Component Value Date   VD25OH 4.0 (L) 07/16/2019   CALCIUM 9.7 07/16/2019   CALCIUM 9.5 05/29/2019  ) Wt Readings  from Last 3 Encounters:  10/16/19 108 lb 12.8 oz (49.4 kg)  07/16/19 103 lb (46.7 kg)  05/29/19 104 lb (47.2 kg)    She was last seen for vitamin D deficiency 3 months ago.  Management since that visit includes advised to start Vitamin D3 1000 units BID. She reports good compliance with treatment. She is not having side effects.   Symptoms: Yes change in energy level No numbness or tingling  No bone pain No unexplained fracture   ---------------------------------------------------------------------------------------------------      Medications: Outpatient Medications Prior to Visit  Medication Sig  . amLODipine (NORVASC) 2.5 MG tablet TAKE 1 TABLET BY MOUTH ONCE DAILY  . donepezil (ARICEPT) 5 MG tablet Take 1 tablet (5 mg total) by mouth at bedtime.  . ferrous sulfate 325 (65 FE) MG EC tablet Take 325 mg by mouth daily.   . potassium chloride SA (KLOR-CON) 20 MEQ tablet TAKE 1 TABLET BY MOUTH ONCE DAILY (Patient taking differently: Take 10 mEq by mouth daily. )  . valsartan (DIOVAN) 40 MG tablet TAKE 1 TABLET BY MOUTH ONCE DAILY   No facility-administered medications prior to visit.    Review of Systems  Constitutional: Negative.   Respiratory: Negative.   Cardiovascular: Negative.   Musculoskeletal: Negative.       Objective    BP (!) 176/75 (BP Location: Left Arm, Patient Position: Sitting, Cuff Size: Normal)  Pulse 87   Temp 98.2 F (36.8 C) (Oral)   Ht 4\' 8"  (1.422 m)   Wt 108 lb 12.8 oz (49.4 kg)   SpO2 98%   BMI 24.39 kg/m    Physical Exam  General appearance: Well developed, well nourished female, cooperative and in no acute distress Head: Normocephalic, without obvious abnormality, atraumatic Respiratory: Respirations even and unlabored, normal respiratory rate Extremities: All extremities are intact.  Skin: Skin color, texture, turgor normal. No rashes seen  Psych: Appropriate mood and affect. Neurologic: Mental status: Alert, oriented to person,  place, and time, thought content appropriate.   No results found for any visits on 10/16/19.  Assessment & Plan     1. Primary hypertension Not to goal, but significantly improved on current medications. Her daughter reported that she did not tolerate higher doses of amlodipine or valsartan. Consider adding low dose thiazide if electrolytes are normal.  - Renal function panel  2. Vitamin D deficiency Due to check  - VITAMIN D 25 Hydroxy (Vit-D Deficiency, Fractures)  3. Hyperglycemia  - Hemoglobin A1c  4. Thrombocytopenia (HCC)  - CBC  5. Dementia without behavioral disturbance, unspecified dementia type [facility]) Her daughter reports she hasn't notice much improvement since starting donepezil, and as more alert and active when she does not take it. Will stop it for now and re-establish baseline. Consider trial of Exelon or namenda in a few months.   6. Need for influenza vaccination  - Flu Vaccine QUAD High Dose IM (Fluad)       The entirety of the information documented in the History of Present Illness, Review of Systems and Physical Exam were personally obtained by me. Portions of this information were initially documented by the CMA and reviewed by me for thoroughness and accuracy.      [facility], MD  [facility] [phone] (phone) [phone] (fax)  [facility]

## 2019-10-17 ENCOUNTER — Telehealth: Payer: Self-pay

## 2019-10-17 DIAGNOSIS — I10 Essential (primary) hypertension: Secondary | ICD-10-CM

## 2019-10-17 LAB — CBC
Hematocrit: 34.6 % (ref 34.0–46.6)
Hemoglobin: 11.6 g/dL (ref 11.1–15.9)
MCH: 29.7 pg (ref 26.6–33.0)
MCHC: 33.5 g/dL (ref 31.5–35.7)
MCV: 89 fL (ref 79–97)
Platelets: 180 10*3/uL (ref 150–450)
RBC: 3.91 x10E6/uL (ref 3.77–5.28)
RDW: 19 % — ABNORMAL HIGH (ref 11.7–15.4)
WBC: 5.4 10*3/uL (ref 3.4–10.8)

## 2019-10-17 LAB — RENAL FUNCTION PANEL
Albumin: 4.7 g/dL — ABNORMAL HIGH (ref 3.6–4.6)
BUN/Creatinine Ratio: 19 (ref 12–28)
BUN: 15 mg/dL (ref 8–27)
CO2: 23 mmol/L (ref 20–29)
Calcium: 9.6 mg/dL (ref 8.7–10.3)
Chloride: 105 mmol/L (ref 96–106)
Creatinine, Ser: 0.8 mg/dL (ref 0.57–1.00)
GFR calc Af Amer: 79 mL/min/{1.73_m2} (ref >59)
GFR calc non Af Amer: 68 mL/min/{1.73_m2} (ref >59)
Glucose: 86 mg/dL (ref 65–99)
Phosphorus: 2.9 mg/dL — ABNORMAL LOW (ref 3.0–4.3)
Potassium: 4.3 mmol/L (ref 3.5–5.2)
Sodium: 141 mmol/L (ref 134–144)

## 2019-10-17 LAB — HEMOGLOBIN A1C
Est. average glucose Bld gHb Est-mCnc: 88 mg/dL
Hgb A1c MFr Bld: 4.7 % — ABNORMAL LOW (ref 4.8–5.6)

## 2019-10-17 LAB — VITAMIN D 25 HYDROXY (VIT D DEFICIENCY, FRACTURES): Vit D, 25-Hydroxy: 5.7 ng/mL — ABNORMAL LOW (ref 30.0–100.0)

## 2019-10-17 NOTE — Telephone Encounter (Signed)
Attempted to call pt. At [phone]; rec'd message that wireless customer is not available.  Attempted to call pt's daughter, Westley Hummer, at [phone] (listed on DPR); rec'd recording that the number is not in service.

## 2019-10-17 NOTE — Telephone Encounter (Signed)
Attempted to contact patient, no answer unable to leave a voicemail. Okay for PEC to advise patient's daughter Jasmin Roth.

## 2019-10-17 NOTE — Telephone Encounter (Signed)
-----   Message from Malva Limes, MD sent at 10/17/2019  7:56 AM EDT ----- Vitamin D levels are extremely low. She needs to take OTC vitamin D3 2000 units every day.  Electrolytes and potassium levels are good. Need to ADD hctz 12.5 mg once every morning, #90, rf x 0 for better blood pressure control.  Please schedule follow up for BP in 6 weeks.

## 2019-10-18 NOTE — Telephone Encounter (Signed)
Daughter given results and instructions. Asking if pt. should continue her potassium pills.Please send HCTZ to her pharmacy.

## 2019-10-20 MED ORDER — HYDROCHLOROTHIAZIDE 12.5 MG PO TABS: 12.5000 mg | ORAL_TABLET | Freq: Every day | ORAL | 1 refills | Status: DC

## 2019-10-20 NOTE — Telephone Encounter (Addendum)
Prescription has been sent to [facility]. Yes, she needs to continue potassium. Please schedule follow up visit for BP check in 5-6 weeks.

## 2019-10-25 NOTE — Telephone Encounter (Signed)
Tried calling patient's daughter Westley Hummer. No answer or voice message system. Will try calling back at a later time.

## 2019-10-29 NOTE — Telephone Encounter (Signed)
Tried calling again. No answer or voice message system to   leave message. Will try calling back later.

## 2019-10-29 NOTE — Telephone Encounter (Signed)
Pt daughter is aware of blood work results and the pt does have follow up appt with dr Sherrie Mustache on 12-05-2019. Pt daughter states her mother is sleeping a lot since starting HCTZ daughter would also like to know how far apart she she been taking bp medications

## 2019-10-29 NOTE — Telephone Encounter (Signed)
From message documentation below, It doesn't sound like patients daughter Jasmin Roth was advise of the last message from Dr. Sherrie Mustache. Follow up appointment has not been scheduled.  I tried calling [name] to confirm that she understood all details of Dr. Theodis Aguas message. No answer or voice message system. Will try calling back later. OK for [facility] triage to speak with patients daughter Jasmin Roth.

## 2019-10-30 ENCOUNTER — Ambulatory Visit: Payer: Self-pay | Admitting: *Deleted

## 2019-10-30 NOTE — Telephone Encounter (Signed)
Dr. Sherrie Mustache, should that blood pressure follow up be on your schedule for an office visit? It is currently on the PT/B12  nurse schedule.

## 2019-10-30 NOTE — Telephone Encounter (Signed)
Attempted to call number listed as contact- "call can not be completed as dialed " message.

## 2019-10-30 NOTE — Telephone Encounter (Signed)
Patient's daughter returned call from previous question about how far apart to give medications due to patient sleeping more. Encouraged patient's daughter to give HCTZ in the am due to diuretic effects. Reviewed care advise on hydration for patient due to patient's daughter reports patient intake not as good as it has been. Reviewed Dr. Theodis Aguas note from 10/17/19 and patient verbalized understanding. appt noted for nurse visit set for 12/05/19 at 11:00 for f/u for B/P check. Patient's daughter is going to give scheduled B/P medications approx. 6 hours apart as she has previously done. Encouraged routine activity and to drink smaller portions multiple times a day in attempts to increase hydration. No further questions at this time.   Reason for Disposition  [1] Follow-up call to recent contact AND [2] information only call, no triage required  Answer Assessment - Initial Assessment Questions 1. REASON FOR CALL or QUESTION: "What is your reason for calling today?" or "How can I best help you?" or "What question do you have that I can help answer?"     Patient's daughter wants to follow up on how far apart should she give B/P medications due to patient sleeping more.  Protocols used: INFORMATION ONLY CALL - NO TRIAGE-A-AH

## 2019-10-31 NOTE — Telephone Encounter (Signed)
Called patient to reschedule appt for B/P check. No answer. Unable to leave voicemail. Will attempt at later time.

## 2019-10-31 NOTE — Telephone Encounter (Signed)
Patient's appointment needs to be on Dr. Theodis Aguas schedule for a blood pressure follow up. The PEC agent that made the appointment for 12/05/2019 scheduled the appointment on a nurse schedule. Tried calling patient's daughter Westley Hummer to reschedule appointment time. No answer or voice message system. OK for PEC to move appointment from nurse schedule to Dr. Theodis Aguas schedule.

## 2019-11-01 NOTE — Telephone Encounter (Signed)
Attempted to contact patients daughter to reschedule appointment. No answer.Unable to leave message. Message to try again later

## 2019-12-05 ENCOUNTER — Ambulatory Visit: Payer: Self-pay

## 2019-12-07 ENCOUNTER — Encounter: Payer: Self-pay | Admitting: Family Medicine

## 2019-12-07 ENCOUNTER — Ambulatory Visit (INDEPENDENT_AMBULATORY_CARE_PROVIDER_SITE_OTHER): Payer: Medicare Other | Admitting: Family Medicine

## 2019-12-07 ENCOUNTER — Other Ambulatory Visit: Payer: Self-pay

## 2019-12-07 VITALS — BP 163/75 | HR 76 | Temp 97.8°F | Resp 16 | Ht <= 58 in | Wt 110.0 lb

## 2019-12-07 DIAGNOSIS — I10 Essential (primary) hypertension: Secondary | ICD-10-CM | POA: Diagnosis not present

## 2019-12-07 DIAGNOSIS — R062 Wheezing: Secondary | ICD-10-CM | POA: Diagnosis not present

## 2019-12-07 DIAGNOSIS — E559 Vitamin D deficiency, unspecified: Secondary | ICD-10-CM | POA: Diagnosis not present

## 2019-12-07 NOTE — Progress Notes (Signed)
Established patient visit   Patient: Jasmin Roth   DOB: 09-Apr-1933   86 y.o. Female  MRN: [id number] Visit Date: 12/07/2019  Today's healthcare provider: Mila Merry, MD   Chief Complaint  Patient presents with  . Hypertension  . Vit D Deficiency   Subjective    HPI  Hypertension, follow-up  BP Readings from Last 3 Encounters:  12/07/19 (!) 163/75  10/16/19 (!) 162/80  07/16/19 (!) 130/58   Wt Readings from Last 3 Encounters:  12/07/19 110 lb (49.9 kg)  10/16/19 108 lb 12.8 oz (49.4 kg)  07/16/19 103 lb (46.7 kg)     She was last seen for hypertension 6 weeks ago.  BP at that visit was 162/80. Management since that visit includes adding hctz 12.5 mg once every morning.  She reports good compliance with treatment. She is not having side effects.  She is following a Regular diet. She is not exercising. She does not smoke.  Use of agents associated with hypertension: none.   Outside blood pressures are not being checked. Symptoms: No chest pain No chest pressure  No palpitations No syncope  No dyspnea No orthopnea  No paroxysmal nocturnal dyspnea No lower extremity edema   Pertinent labs: Lab Results  Component Value Date   CHOL 176 01/30/2019   HDL 66 01/30/2019   LDLCALC 97 01/30/2019   TRIG 71 01/30/2019   CHOLHDL 2.7 01/30/2019   Lab Results  Component Value Date   NA 141 10/16/2019   K 4.3 10/16/2019   CREATININE 0.80 10/16/2019   GFRNONAA 68 10/16/2019   GFRAA 79 10/16/2019   GLUCOSE 86 10/16/2019     The ASCVD Risk score (Goff DC Jr., et al., 2013) failed to calculate for the following reasons:   The 2013 ASCVD risk score is only valid for ages 66 to 57   Follow up for Vitamin D deficiency:  The patient was last seen for this 6 weeks ago. Changes made at last visit include starting OTC vitamin D3 2000 units every day.   Lab Results  Component Value Date   VD25OH 5.7 (L) 10/16/2019    She reports good compliance with  treatment. She feels that condition is Unchanged. She is not having side effects.    Her daughter reports that patient has had mild episodes of wheezing in the mornings and evenings the last few day. Has slightly dry cough, no fever or dyspnea      Medications: Outpatient Medications Prior to Visit  Medication Sig  . amLODipine (NORVASC) 2.5 MG tablet TAKE 1 TABLET BY MOUTH ONCE DAILY  . ferrous sulfate 325 (65 FE) MG EC tablet Take 325 mg by mouth daily.   . hydrochlorothiazide (HYDRODIURIL) 12.5 MG tablet Take 1 tablet (12.5 mg total) by mouth daily.  . potassium chloride SA (KLOR-CON) 20 MEQ tablet TAKE 1 TABLET BY MOUTH ONCE DAILY (Patient taking differently: Take 10 mEq by mouth daily. )  . valsartan (DIOVAN) 40 MG tablet TAKE 1 TABLET BY MOUTH ONCE DAILY   No facility-administered medications prior to visit.    Review of Systems  Constitutional: Negative.   Respiratory: Negative.   Cardiovascular: Negative.   Musculoskeletal: Negative.   Neurological: Negative.   Psychiatric/Behavioral: Negative.       Objective    BP (!) 163/75   Pulse 76   Temp 97.8 F (36.6 C)   Resp 16   Ht 4\' 8"  (1.422 m)   Wt 110 lb (49.9 kg)  SpO2 100%   BMI 24.66 kg/m    Physical Exam    General: Appearance:    Well developed, well nourished female in no acute distress  Eyes:    PERRL, conjunctiva/corneas clear, EOM's intact       Lungs:     Clear to auscultation bilaterally, respirations unlabored  Heart:    Normal heart rate. Normal rhythm. No murmurs, rubs, or gallops. No edema.  MS:   All extremities are intact.   Neurologic:   Awake, alert, oriented x 3. No apparent focal neurological           defect.         Assessment & Plan     1. Primary hypertension Tolerating initiation of hctz well, but blood pressure not ideally controlled. Consider increasing dose after reviewing labs.  - Renal function panel  2. Vitamin D deficiency Tolerating initiation of vitamin d after  last visit.  - VITAMIN D 25 Hydroxy (Vit-D Deficiency, Fractures)  3. Wheezing Mild and intermittent, normal exam, rr and oximetry. No indication of chf. Likely weather related. She is to call if worsens or associated with any other respiratory sx.    No follow-ups on file.         Mila Merry, MD  [facility] [phone] (phone) [phone] (fax)  [facility]

## 2019-12-08 LAB — RENAL FUNCTION PANEL
Albumin: 4.5 g/dL (ref 3.6–4.6)
BUN/Creatinine Ratio: 28 (ref 12–28)
BUN: 30 mg/dL — ABNORMAL HIGH (ref 8–27)
CO2: 22 mmol/L (ref 20–29)
Calcium: 9.8 mg/dL (ref 8.7–10.3)
Chloride: 104 mmol/L (ref 96–106)
Creatinine, Ser: 1.09 mg/dL — ABNORMAL HIGH (ref 0.57–1.00)
GFR calc Af Amer: 54 mL/min/{1.73_m2} — ABNORMAL LOW (ref >59)
GFR calc non Af Amer: 47 mL/min/{1.73_m2} — ABNORMAL LOW (ref >59)
Glucose: 92 mg/dL (ref 65–99)
Phosphorus: 2.8 mg/dL — ABNORMAL LOW (ref 3.0–4.3)
Potassium: 4.5 mmol/L (ref 3.5–5.2)
Sodium: 139 mmol/L (ref 134–144)

## 2019-12-08 LAB — VITAMIN D 25 HYDROXY (VIT D DEFICIENCY, FRACTURES): Vit D, 25-Hydroxy: 21.8 ng/mL — ABNORMAL LOW (ref 30.0–100.0)

## 2019-12-11 ENCOUNTER — Telehealth: Payer: Self-pay

## 2019-12-11 DIAGNOSIS — I10 Essential (primary) hypertension: Secondary | ICD-10-CM

## 2019-12-11 MED ORDER — VALSARTAN 40 MG PO TABS: 40.0000 mg | ORAL_TABLET | Freq: Every day | ORAL | 2 refills | Status: DC

## 2019-12-11 NOTE — Telephone Encounter (Signed)
-----   Message from Malva Limes, MD sent at 12/10/2019  6:50 AM EST ----- Vitamin d levels much better. Kidney functions stable. Need to increase hctz to 2 x 12.5mg  once a day . Continue amlodipine. Will send in prescription for 25mg  hctz before her 12.5 tablets run out in a few week.

## 2019-12-11 NOTE — Telephone Encounter (Signed)
Yes, she needs to continue the valsartan, have sent refill to her pharmacy.

## 2019-12-11 NOTE — Telephone Encounter (Signed)
Patient's daughter Westley Hummer advised and verbalized understanding.

## 2019-12-11 NOTE — Telephone Encounter (Signed)
FisherCopied from CRM [id number]. Topic: General - Other >> Dec 11, 2019 11:01 AM Lyn Hollingshead D wrote: PT calling to f/up on her Lab results / please advise

## 2019-12-11 NOTE — Telephone Encounter (Signed)
Patients daughter Westley Hummer advised and verbalized understanding. She wants to know if patient should continue taking Valsartan as well? She also wants to know when patient needs to come back in for a follow up?

## 2019-12-24 ENCOUNTER — Telehealth: Payer: Self-pay | Admitting: Family Medicine

## 2019-12-24 ENCOUNTER — Other Ambulatory Visit: Payer: Self-pay | Admitting: Family Medicine

## 2019-12-24 DIAGNOSIS — I10 Essential (primary) hypertension: Secondary | ICD-10-CM

## 2019-12-24 MED ORDER — HYDROCHLOROTHIAZIDE 12.5 MG PO TABS: 25.0000 mg | ORAL_TABLET | Freq: Every day | ORAL | 3 refills | Status: DC

## 2019-12-24 NOTE — Telephone Encounter (Signed)
Pts daughter called regarding the pts hydrochlorothiazide prescription. She states that at the pts last appt it was discussed to have the pts medication doubled, but the prescription sent to the pharmacy had no changes made. Please advise.      [facility] [place], [place] - [address]  [address] [place] [address]  Phone: [phone] Fax: [phone]  Hours: Not open 24 hours

## 2019-12-24 NOTE — Telephone Encounter (Signed)
I called and spoke with pharmacist and changed the prescription of HCTZ to 25mg  daily as instructed in phone message from 12/11/2019. Patient's daughter 14/07/2019 advised.

## 2020-03-21 ENCOUNTER — Telehealth: Payer: Self-pay

## 2020-03-21 NOTE — Telephone Encounter (Signed)
Copied from CRM [id number]. Topic: Appointment Scheduling - Scheduling Inquiry for Clinic >> Mar 20, 2020  4:29 PM Randol Kern wrote: Reason for CRM: Pt's daughter called requesting a call back from nurse/PCP regarding questions about the next visit patient needs to schedule Best contact: [phone]

## 2020-03-21 NOTE — Telephone Encounter (Signed)
Please advise 

## 2020-03-21 NOTE — Telephone Encounter (Signed)
She is due for follow up sometime in the next 2-3 weeks. Please schedule

## 2020-03-25 NOTE — Telephone Encounter (Signed)
Tried to call to set up appt and didn't get an answer and the VM is not set up to leave a message.

## 2020-03-27 NOTE — Telephone Encounter (Signed)
Tried calling this # multiple times to schedule an appt. But there is no VM set up.

## 2020-03-31 NOTE — Telephone Encounter (Signed)
Pt's daughter Westley Hummer returned call, declined offer for an appt. States she needs to speak to office nurse/cma as soon as possible. She has important questions to discuss prior to speaking with pharmacist today. Please advise   [phone]  Westley Hummer is having technical difficulties, she states that her phone has not received any missed calls.

## 2020-04-02 NOTE — Telephone Encounter (Signed)
[name] called back and decided to schedule an appt for the PT. Caller still wanted a call back regarding the Pts potassium pills, stated she cannot swallow them and wants to know if she can crush them up or dissolve them.

## 2020-04-02 NOTE — Telephone Encounter (Signed)
FYI: I called and spoke with patient's daughter Westley Hummer. She states she already talked with the pharmacist and they advised her that the potassium supplement could be crushed.

## 2020-04-11 ENCOUNTER — Ambulatory Visit: Payer: Medicare Other | Admitting: Family Medicine

## 2020-04-25 ENCOUNTER — Ambulatory Visit: Payer: Medicare Other | Admitting: Family Medicine

## 2020-05-12 ENCOUNTER — Encounter: Payer: Self-pay | Admitting: Family Medicine

## 2020-05-12 ENCOUNTER — Ambulatory Visit (INDEPENDENT_AMBULATORY_CARE_PROVIDER_SITE_OTHER): Payer: Medicare Other | Admitting: Family Medicine

## 2020-05-12 ENCOUNTER — Other Ambulatory Visit: Payer: Self-pay

## 2020-05-12 VITALS — BP 165/77 | HR 85 | Wt 107.0 lb

## 2020-05-12 DIAGNOSIS — M25551 Pain in right hip: Secondary | ICD-10-CM

## 2020-05-12 DIAGNOSIS — I10 Essential (primary) hypertension: Secondary | ICD-10-CM | POA: Diagnosis not present

## 2020-05-12 NOTE — Progress Notes (Signed)
Established patient visit   Patient: Jasmin Roth   DOB: Apr 27, 1933   87 y.o. Female  MRN: [id number] Visit Date: 05/12/2020  Today's healthcare provider: Mila Merry, MD   No chief complaint on file.  Subjective    HPI  Hypertension, follow-up  BP Readings from Last 3 Encounters:  05/12/20 (!) 165/77  12/07/19 (!) 163/75  10/16/19 (!) 162/80   Wt Readings from Last 3 Encounters:  05/12/20 107 lb (48.5 kg)  12/07/19 110 lb (49.9 kg)  10/16/19 108 lb 12.8 oz (49.4 kg)     She was last seen for hypertension 6 months ago.  BP at that visit was 163/75. Management since that visit includes no changes.  She reports excellent compliance with treatment. She is not having side effects.  She is following a Regular diet. She is exercising. She does not smoke.  Use of agents associated with hypertension: none.   Outside blood pressures are not being checked at home. Symptoms: No chest pain No chest pressure  No palpitations No syncope  No dyspnea No orthopnea  No paroxysmal nocturnal dyspnea No lower extremity edema   Pertinent labs: Lab Results  Component Value Date   CHOL 176 01/30/2019   HDL 66 01/30/2019   LDLCALC 97 01/30/2019   TRIG 71 01/30/2019   CHOLHDL 2.7 01/30/2019   Lab Results  Component Value Date   NA 139 12/07/2019   K 4.5 12/07/2019   CREATININE 1.09 (H) 12/07/2019   GFRNONAA 47 (L) 12/07/2019   GFRAA 54 (L) 12/07/2019   GLUCOSE 92 12/07/2019     The ASCVD Risk score (Goff DC Jr., et al., 2013) failed to calculate for the following reasons:   The 2013 ASCVD risk score is only valid for ages 39 to 1   ---------------------------------------------------------------------------------------------------  Also reports not been eating well the last few months. Also has old injury to right leg previously on tramadol for pain, but has been off of it for a few years. Has been having more pain lately. A No Known Allergies    Medications: Outpatient Medications Prior to Visit  Medication Sig  . amLODipine (NORVASC) 2.5 MG tablet TAKE 1 TABLET BY MOUTH ONCE DAILY  . ferrous sulfate 325 (65 FE) MG EC tablet Take 325 mg by mouth daily.   . hydrochlorothiazide (HYDRODIURIL) 25 MG tablet Take 25 mg by mouth daily.  . potassium chloride SA (KLOR-CON) 20 MEQ tablet TAKE 1 TABLET BY MOUTH ONCE DAILY (Patient taking differently: Take 10 mEq by mouth daily.)  . valsartan (DIOVAN) 40 MG tablet Take 1 tablet (40 mg total) by mouth daily.  Marland Kitchen VITAMIN D PO Take by mouth.  . [DISCONTINUED] hydrochlorothiazide (HYDRODIURIL) 12.5 MG tablet Take 2 tablets (25 mg total) by mouth daily.   No facility-administered medications prior to visit.    Review of Systems  Constitutional: Positive for appetite change (No appetite in the last few weeks. ) and fatigue (Pt's daughter reports she is napping a lot during the day.). Negative for activity change, chills, diaphoresis, fever and unexpected weight change.  Respiratory: Negative.   Cardiovascular: Negative.   Gastrointestinal: Negative.   Musculoskeletal: Positive for arthralgias (Right hip pain). Negative for back pain, gait problem, joint swelling, myalgias, neck pain and neck stiffness.  Neurological: Positive for weakness. Negative for dizziness, light-headedness and headaches.        Objective    BP (!) 165/77 (BP Location: Left Arm, Patient Position: Sitting, Cuff Size: Normal)   Pulse  85   Wt 107 lb (48.5 kg)   BMI 23.99 kg/m    Physical Exam   General: Appearance:    Well developed, well nourished female in no acute distress  Eyes:    PERRL, conjunctiva/corneas clear, EOM's intact       Lungs:     Clear to auscultation bilaterally, respirations unlabored  Heart:    Normal heart rate. Normal rhythm. No murmurs, rubs, or gallops.   MS:   All extremities are intact.   Neurologic:   Awake, alert, oriented x 3. No apparent focal neurological           defect.          Assessment & Plan     1. Right hip pain  - DG Hip Unilat W OR W/O Pelvis 1V Right; Future - DG FEMUR 1V RIGHT; Future  2. Primary hypertension Not to goal. Consider increasing amlodipine after reviewing. Labs.  - CBC - Comprehensive metabolic panel - TSH       The entirety of the information documented in the History of Present Illness, Review of Systems and Physical Exam were personally obtained by me. Portions of this information were initially documented by the CMA and reviewed by me for thoroughness and accuracy.      Mila Merry, MD  [facility] [phone] (phone) [phone] (fax)  [facility]

## 2020-05-13 LAB — CBC
Hematocrit: 30.6 % — ABNORMAL LOW (ref 34.0–46.6)
Hemoglobin: 10.6 g/dL — ABNORMAL LOW (ref 11.1–15.9)
MCH: 30.8 pg (ref 26.6–33.0)
MCHC: 34.6 g/dL (ref 31.5–35.7)
MCV: 89 fL (ref 79–97)
Platelets: 241 10*3/uL (ref 150–450)
RBC: 3.44 x10E6/uL — ABNORMAL LOW (ref 3.77–5.28)
RDW: 18.6 % — ABNORMAL HIGH (ref 11.7–15.4)
WBC: 7.9 10*3/uL (ref 3.4–10.8)

## 2020-05-13 LAB — COMPREHENSIVE METABOLIC PANEL
ALT: 5 IU/L (ref 0–32)
AST: 11 IU/L (ref 0–40)
Albumin/Globulin Ratio: 1.6 (ref 1.2–2.2)
Albumin: 4.2 g/dL (ref 3.6–4.6)
Alkaline Phosphatase: 56 IU/L (ref 44–121)
BUN/Creatinine Ratio: 20 (ref 12–28)
BUN: 18 mg/dL (ref 8–27)
Bilirubin Total: 1.1 mg/dL (ref 0.0–1.2)
CO2: 21 mmol/L (ref 20–29)
Calcium: 9.9 mg/dL (ref 8.7–10.3)
Chloride: 104 mmol/L (ref 96–106)
Creatinine, Ser: 0.91 mg/dL (ref 0.57–1.00)
Globulin, Total: 2.7 g/dL (ref 1.5–4.5)
Glucose: 98 mg/dL (ref 65–99)
Potassium: 3.5 mmol/L (ref 3.5–5.2)
Sodium: 141 mmol/L (ref 134–144)
Total Protein: 6.9 g/dL (ref 6.0–8.5)
eGFR: 62 mL/min/{1.73_m2} (ref >59)

## 2020-05-13 LAB — TSH: TSH: 1.62 u[IU]/mL (ref 0.450–4.500)

## 2020-05-16 ENCOUNTER — Ambulatory Visit
Admission: RE | Admit: 2020-05-16 | Discharge: 2020-05-16 | Disposition: A | Discharge status: Home / Self Care | Payer: Medicare Other | Source: Ambulatory Visit | Attending: Family Medicine | Admitting: Family Medicine

## 2020-05-16 ENCOUNTER — Ambulatory Visit
Admission: RE | Admit: 2020-05-16 | Discharge: 2020-05-16 | Disposition: A | Discharge status: Home / Self Care | Payer: Medicare Other | Attending: Family Medicine | Admitting: Family Medicine

## 2020-05-16 DIAGNOSIS — M25551 Pain in right hip: Secondary | ICD-10-CM

## 2020-05-20 ENCOUNTER — Telehealth: Payer: Self-pay

## 2020-05-20 MED ORDER — AMLODIPINE BESYLATE 5 MG PO TABS: 5.0000 mg | ORAL_TABLET | Freq: Every day | ORAL | 1 refills | Status: DC

## 2020-05-20 NOTE — Addendum Note (Signed)
Addended by: Paticia Stack on: 05/20/2020 11:46 AM   Modules accepted: Orders

## 2020-05-20 NOTE — Telephone Encounter (Signed)
Patients daughter has been advised of labs and prescription has been sent into pharmacy. Patient needs to schedule 2-3 month follow up appointment. OK for [facility] triage to schedule follow up appointment around July or August.

## 2020-05-20 NOTE — Telephone Encounter (Addendum)
-----   Message from Paticia Stack, [place] sent at 05/20/2020 11:49 AM EDT ----- Rx ordered.  VM box full. Patient needs f/u appt 2-3 months from now per Dr. Sherrie Mustache. Please assess       Jasmin Limes, MD  05/15/2020 8:27 AM EDT      Labs all stable. Need to increase amlodipine to 5mg  once a day due to high blood pressure, can send in prescription for #90 1 refill and schedule follow up 2-3 months for follow up of blood pressure.

## 2020-05-28 ENCOUNTER — Telehealth: Payer: Self-pay

## 2020-05-28 NOTE — Telephone Encounter (Signed)
Copied from CRM [id number]. Topic: General - Other >> May 28, 2020  2:42 PM Jaquita Rector A wrote: Reason for CRM: Patient daughter Westley Hummer called in with concerns about a spot that is on patients back seem to have fallen off but area is now dark and red around the surface seem to be infected. Asking for a call back please if someone can see her this week Please call Ph# [phone]

## 2020-05-30 MED ORDER — MUPIROCIN CALCIUM 2 % EX CREA: 1.0000 "application " | TOPICAL_CREAM | Freq: Two times a day (BID) | CUTANEOUS | 1 refills | Status: DC

## 2020-05-30 NOTE — Telephone Encounter (Signed)
No availability next week to be seen. Virtual visits are available the week after. Any recommendations?

## 2020-05-30 NOTE — Telephone Encounter (Signed)
Apply mupirocin cream to area daily and cover with band-aid. Have sent prescription to [facility]. Call if not improving by Tuesday. Go to urgent care if gets any worse over the weekend.

## 2020-05-30 NOTE — Telephone Encounter (Signed)
Advised patient's daughter as below. She will try the cream that was sent into the pharmacy. She reports that the skin lesion is new, and she is concerned that it could be some type of skin cancer. She would feel better if someone take a look, but understands that you dont have any openings. She was wondering if a dermatologist could look at it? Declined urgent care recommendation. Please advise. Thanks!

## 2020-06-01 NOTE — Telephone Encounter (Signed)
OK, to refer to dermatology, but it will probably take a few months to get in. There's a 2:40 appt available with me on the 7th.

## 2020-06-03 NOTE — Telephone Encounter (Signed)
I called [name] and advised her as below. Appointment scheduled here at [facility] with [facility] for 06/13/2020. [name] wants to wait until patient sees Dr. Sherrie Mustache before Dermatology referral is placed.

## 2020-06-06 ENCOUNTER — Other Ambulatory Visit: Payer: Self-pay | Admitting: Family Medicine

## 2020-06-06 DIAGNOSIS — E876 Hypokalemia: Secondary | ICD-10-CM

## 2020-06-06 NOTE — Telephone Encounter (Signed)
Future visit in 1 week  

## 2020-06-13 ENCOUNTER — Ambulatory Visit: Payer: Self-pay | Admitting: Family Medicine

## 2020-06-13 NOTE — Progress Notes (Deleted)
      Established patient visit   Patient: Jasmin Roth   DOB: 1933/11/15   87 y.o. Female  MRN: [id number] Visit Date: 06/13/2020  Today's healthcare provider: Mila Merry, MD   No chief complaint on file.  Subjective    HPI  Skin lesion: Patient comes in for an evaluation of skin lesions located in her back.   {Show patient history (optional):23778}   Medications: Outpatient Medications Prior to Visit  Medication Sig   amLODipine (NORVASC) 5 MG tablet Take 1 tablet (5 mg total) by mouth daily.   amLODipine (NORVASC) 2.5 MG tablet TAKE 1 TABLET BY MOUTH ONCE DAILY   ferrous sulfate 325 (65 FE) MG EC tablet Take 325 mg by mouth daily.    hydrochlorothiazide (HYDRODIURIL) 25 MG tablet Take 25 mg by mouth daily.   mupirocin cream (BACTROBAN) 2 % Apply 1 application topically 2 (two) times daily.   potassium chloride SA (KLOR-CON) 20 MEQ tablet TAKE 1 TABLET BY MOUTH ONCE DAILY   valsartan (DIOVAN) 40 MG tablet Take 1 tablet (40 mg total) by mouth daily.   VITAMIN D PO Take by mouth.   No facility-administered medications prior to visit.    Review of Systems  {Labs  Heme  Chem  Endocrine  Serology  Results Review (optional):23779}   Objective    There were no vitals taken for this visit. {Show previous vital signs (optional):23777}   Physical Exam  ***  No results found for any visits on 06/13/20.  Assessment & Plan     ***  No follow-ups on file.      {provider attestation***:1}   Mila Merry, MD  [facility] [phone] (phone) [phone] (fax)  [facility]

## 2020-06-17 ENCOUNTER — Ambulatory Visit: Payer: Self-pay | Admitting: Family Medicine

## 2020-07-04 ENCOUNTER — Other Ambulatory Visit: Payer: Self-pay | Admitting: Family Medicine

## 2020-07-04 DIAGNOSIS — I10 Essential (primary) hypertension: Secondary | ICD-10-CM

## 2020-07-04 NOTE — Telephone Encounter (Signed)
Future visit in 1 month  

## 2020-07-25 ENCOUNTER — Ambulatory Visit: Payer: Self-pay | Admitting: Family Medicine

## 2020-08-11 ENCOUNTER — Ambulatory Visit: Payer: Medicare Other | Admitting: Family Medicine

## 2020-10-03 ENCOUNTER — Ambulatory Visit (INDEPENDENT_AMBULATORY_CARE_PROVIDER_SITE_OTHER): Payer: Medicare Other | Admitting: Family Medicine

## 2020-10-03 ENCOUNTER — Other Ambulatory Visit: Payer: Self-pay

## 2020-10-03 ENCOUNTER — Encounter: Payer: Self-pay | Admitting: Family Medicine

## 2020-10-03 VITALS — BP 178/77 | HR 85 | Temp 98.8°F | Wt 100.0 lb

## 2020-10-03 DIAGNOSIS — Z23 Encounter for immunization: Secondary | ICD-10-CM

## 2020-10-03 DIAGNOSIS — S60222A Contusion of left hand, initial encounter: Secondary | ICD-10-CM

## 2020-10-03 DIAGNOSIS — I10 Essential (primary) hypertension: Secondary | ICD-10-CM

## 2020-10-03 DIAGNOSIS — E876 Hypokalemia: Secondary | ICD-10-CM | POA: Diagnosis not present

## 2020-10-03 MED ORDER — POTASSIUM CHLORIDE CRYS ER 20 MEQ PO TBCR: 20.0000 meq | EXTENDED_RELEASE_TABLET | Freq: Every day | ORAL | 4 refills | Status: DC

## 2020-10-03 MED ORDER — AMLODIPINE BESYLATE 5 MG PO TABS: 5.0000 mg | ORAL_TABLET | Freq: Every day | ORAL | 3 refills | Status: DC

## 2020-10-03 NOTE — Progress Notes (Signed)
Established patient visit   Patient: Jasmin Roth   DOB: [birth year]   87 y.o. Female  MRN: [id number] Visit Date: 10/03/2020  Today's healthcare provider: Lelon Huh, MD   Chief Complaint  Patient presents with   Hypertension   Fatigue   Hand Injury   Subjective    Hand Injury  There was no injury mechanism. The pain is present in the left hand. The pain does not radiate. She has tried nothing for the symptoms.   Hypertension, follow-up  BP Readings from Last 3 Encounters:  10/03/20 (!) 178/77  05/12/20 (!) 165/77  12/07/19 (!) 163/75   Wt Readings from Last 3 Encounters:  10/03/20 100 lb (45.4 kg)  05/12/20 107 lb (48.5 kg)  12/07/19 110 lb (49.9 kg)     She was last seen for hypertension 4 months ago.  BP at that visit was 165/77. Management since that visit includes increasing amlodipine to 37m daily.  She reports poor compliance with treatment.  Pt's daughter states the pharmacy filled 2.585mand didn't realize it until recently.  She is having side effects. Fatigue? She is following a Low Sodium diet. She is not exercising. She does not smoke.  Use of agents associated with hypertension: none.   Outside blood pressures are none. Symptoms: No chest pain No chest pressure  No palpitations No syncope  No dyspnea No orthopnea  No paroxysmal nocturnal dyspnea No lower extremity edema   Pertinent labs: Lab Results  Component Value Date   CHOL 176 01/30/2019   HDL 66 01/30/2019   LDLCALC 97 01/30/2019   TRIG 71 01/30/2019   CHOLHDL 2.7 01/30/2019   Lab Results  Component Value Date   NA 141 05/12/2020   K 3.5 05/12/2020   CREATININE 0.91 05/12/2020   EGFR 62 05/12/2020   GFRNONAA 47 (L) 12/07/2019   GLUCOSE 98 05/12/2020     The ASCVD Risk score (Arnett DK, et al., 2019) failed to calculate for the following reasons:   The 2019 ASCVD risk score is only valid for ages 4058o 7963  ---------------------------------------------------------------------------------------------------  Skin Lesions    Medications: Outpatient Medications Prior to Visit  Medication Sig   amLODipine (NORVASC) 2.5 MG tablet TAKE 1 TABLET BY MOUTH ONCE DAILY   ferrous sulfate 325 (65 FE) MG EC tablet Take 325 mg by mouth daily.    hydrochlorothiazide (HYDRODIURIL) 25 MG tablet Take 25 mg by mouth daily.   valsartan (DIOVAN) 40 MG tablet TAKE 1 TABLET BY MOUTH ONCE DAILY   VITAMIN D PO Take by mouth.   amLODipine (NORVASC) 5 MG tablet Take 1 tablet (5 mg total) by mouth daily. (Patient not taking: Reported on 10/03/2020)   mupirocin cream (BACTROBAN) 2 % Apply 1 application topically 2 (two) times daily. (Patient not taking: Reported on 10/03/2020)   potassium chloride SA (KLOR-CON) 20 MEQ tablet TAKE 1 TABLET BY MOUTH ONCE DAILY (Patient not taking: Reported on 10/03/2020)   No facility-administered medications prior to visit.    Review of Systems  Constitutional:  Positive for fatigue (Pt is wondering if her medications is causing her sleepiness.). Negative for activity change, appetite change, chills, diaphoresis, fever and unexpected weight change.  Respiratory: Negative.    Cardiovascular: Negative.   Gastrointestinal: Negative.   Skin:  Positive for color change and rash. Negative for pallor and wound.       Pt reports having skin changes on her back.   Neurological:  Negative for dizziness,  seizures, syncope, speech difficulty, light-headedness and headaches.      Objective    BP (!) 178/77   Pulse 85   Temp 98.8 F (37.1 C) (Oral)   Wt 100 lb (45.4 kg)   SpO2 97%   BMI 22.42 kg/m    Physical Exam   General appearance: Well developed, well nourished female, cooperative and in no acute distress Head: Normocephalic, without obvious abnormality, atraumatic Respiratory: Respirations even and unlabored, normal respiratory rate Extremities: Bruising discoloration noted  dorsal aspect of left third digit.   Assessment & Plan     1. Primary hypertension Was doing well with 24m amlodipine but pharmacist refilled the 2.5 in July instead. New prescription sent for amLODipine (NORVASC) 5 MG tablet; Take 1 tablet (5 mg total) by mouth daily.  Dispense: 90 tablet; Refill: 3  2. Hypokalemia refill potassium chloride SA (KLOR-CON) 20 MEQ tablet; Take 1 tablet (20 mEq total) by mouth daily.  Dispense: 90 tablet; Refill: 4  3. Contusion of left hand, initial encounter Initial injury was on back of hand but bruising has migrated into third finger. Reassured of normal healing process.    Flu vaccine administered today.   Future Appointments  Date Time Provider DBeverly 01/12/2021  3:20 PM FBirdie Sons MD [facility]        The entirety of the information documented in the History of Present Illness, Review of Systems and Physical Exam were personally obtained by me. Portions of this information were initially documented by the CMA and reviewed by me for thoroughness and accuracy.     DLelon Huh MD  [phone](phone) [phone](fax)  [place]

## 2020-10-06 ENCOUNTER — Other Ambulatory Visit: Payer: Self-pay | Admitting: Family Medicine

## 2020-10-06 ENCOUNTER — Ambulatory Visit: Payer: Self-pay

## 2020-10-06 NOTE — Telephone Encounter (Signed)
Reason for Disposition . [1] Chest pain lasts < 5 minutes AND [2] NO chest pain or cardiac symptoms (e.g., breathing difficulty, sweating) now (Exception: chest pains that last only a few seconds)  Answer Assessment - Initial Assessment Questions 1. LOCATION: "Where does it hurt?"       Middle 2. RADIATION: "Does the pain go anywhere else?" (e.g., into neck, jaw, arms, back)     No 3. ONSET: "When did the chest pain begin?" (Minutes, hours or days)      Friday 4. PATTERN "Does the pain come and go, or has it been constant since it started?"  "Does it get worse with exertion?"      Comes and goes 5. DURATION: "How long does it last" (e.g., seconds, minutes, hours)     Unsure 6. SEVERITY: "How bad is the pain?"  (e.g., Scale 1-10; mild, moderate, or severe)    - MILD (1-3): doesn't interfere with normal activities     - MODERATE (4-7): interferes with normal activities or awakens from sleep    - SEVERE (8-10): excruciating pain, unable to do any normal activities       Ache    Unsure 7. CARDIAC RISK FACTORS: "Do you have any history of heart problems or risk factors for heart disease?" (e.g., angina, prior heart attack; diabetes, high blood pressure, high cholesterol, smoker, or strong family history of heart disease)     No 8. PULMONARY RISK FACTORS: "Do you have any history of lung disease?"  (e.g., blood clots in lung, asthma, emphysema, birth control pills)     No 9. CAUSE: "What do you think is causing the chest pain?"     Unsure 10. OTHER SYMPTOMS: "Do you have any other symptoms?" (e.g., dizziness, nausea, vomiting, sweating, fever, difficulty breathing, cough)       No 11. PREGNANCY: "Is there any chance you are pregnant?" "When was your last menstrual period?"       No  Protocols used: Chest Pain-A-AH

## 2020-10-06 NOTE — Telephone Encounter (Signed)
Daughter reports pt. Had an appointment Friday and reports the driver of the van lifted the pt. That today to get her in the vehicle. Pt. Has complained of chest pain in the middle of her chest on and off since then. Hurts more with movement when pt. Gets in and out of bed and when she walks around at times. No other symptoms. Pt. States "it's not a hard pain." Appointment made for tomorrow. Instructed daughter to go to ED for worsening of symptoms. Verbalizes understanding/.

## 2020-10-07 ENCOUNTER — Other Ambulatory Visit: Payer: Self-pay

## 2020-10-07 ENCOUNTER — Encounter: Payer: Self-pay | Admitting: Family Medicine

## 2020-10-07 ENCOUNTER — Ambulatory Visit (INDEPENDENT_AMBULATORY_CARE_PROVIDER_SITE_OTHER): Payer: Medicare Other | Admitting: Family Medicine

## 2020-10-07 VITALS — BP 184/75 | HR 86 | Temp 97.4°F | Resp 18 | Wt 99.0 lb

## 2020-10-07 DIAGNOSIS — I10 Essential (primary) hypertension: Secondary | ICD-10-CM | POA: Diagnosis not present

## 2020-10-07 DIAGNOSIS — R079 Chest pain, unspecified: Secondary | ICD-10-CM

## 2020-10-07 NOTE — Addendum Note (Signed)
Addended by: Kavin Leech E on: 10/07/2020 03:23 PM   Modules accepted: Orders

## 2020-10-07 NOTE — Progress Notes (Signed)
      Established patient visit   Patient: Jasmin Roth   DOB: 17-Jul-1933   87 y.o. Female  MRN: [id number] Visit Date: 10/07/2020  Today's healthcare provider: Mila Merry, MD   Chief Complaint  Patient presents with   Chest Pain   Subjective    Chest Pain  This is a new problem. Episode onset: 4 days ago aftrer being lifted by her Best boy. Pain location: center of chest. Pertinent negatives include no abdominal pain, dizziness, fever, nausea, palpitations, shortness of breath, vomiting or weakness. The pain is aggravated by movement.   Patient daughter states that pain came back briefly later that nigh and yesterday. Seems to occur when she is up moving around but seems to go away after she sits back down. Has not had any pain today. Points to center of lower of chest as area where pain occurred.     Medications: Outpatient Medications Prior to Visit  Medication Sig   amLODipine (NORVASC) 5 MG tablet Take 1 tablet (5 mg total) by mouth daily.   ferrous sulfate 325 (65 FE) MG EC tablet Take 325 mg by mouth daily.    hydrochlorothiazide (HYDRODIURIL) 25 MG tablet TAKE 1 TABLET BY MOUTH ONCE DAILY   mupirocin cream (BACTROBAN) 2 % Apply 1 application topically 2 (two) times daily.   potassium chloride SA (KLOR-CON) 20 MEQ tablet Take 1 tablet (20 mEq total) by mouth daily.   valsartan (DIOVAN) 40 MG tablet TAKE 1 TABLET BY MOUTH ONCE DAILY   VITAMIN D PO Take by mouth.   No facility-administered medications prior to visit.    Review of Systems  Constitutional:  Negative for appetite change, chills, fatigue and fever.  Respiratory:  Negative for chest tightness and shortness of breath.   Cardiovascular:  Positive for chest pain. Negative for palpitations.  Gastrointestinal:  Negative for abdominal pain, nausea and vomiting.  Neurological:  Negative for dizziness and weakness.      Objective    BP (!) 184/75 (BP Location: Left Arm, Patient Position: Sitting, Cuff  Size: Normal)   Pulse 86   Temp (!) 97.4 F (36.3 C) (Oral)   Resp 18   Wt 99 lb (44.9 kg)   SpO2 97% Comment: room air  BMI 22.20 kg/m  {Show previous vital signs (optional):23777}  Physical Exam   No chest wall tenderness or deformities. Heart RRR with no murmurs. Chest CTAB  EKG: No acute changes  Assessment & Plan     1. Chest pain, unspecified type Normal EKG. Rule out cardiac angina - Troponin T        The entirety of the information documented in the History of Present Illness, Review of Systems and Physical Exam were personally obtained by me. Portions of this information were initially documented by the CMA and reviewed by me for thoroughness and accuracy.     Mila Merry, MD  [facility] [phone] (phone) [phone] (fax)  [facility]

## 2020-10-08 LAB — TROPONIN T: Troponin T (Highly Sensitive): 19 ng/L (ref 0–14)

## 2020-10-09 ENCOUNTER — Other Ambulatory Visit: Payer: Self-pay

## 2020-10-09 DIAGNOSIS — R079 Chest pain, unspecified: Secondary | ICD-10-CM

## 2020-10-09 MED ORDER — ASPIRIN 81 MG PO TBEC: 81.0000 mg | DELAYED_RELEASE_TABLET | Freq: Every day | ORAL | 12 refills | Status: DC

## 2020-10-09 MED ORDER — METOPROLOL SUCCINATE ER 25 MG PO TB24: 12.5000 mg | ORAL_TABLET | Freq: Every day | ORAL | 1 refills | Status: DC

## 2020-10-15 ENCOUNTER — Other Ambulatory Visit: Payer: Self-pay

## 2020-10-15 DIAGNOSIS — R079 Chest pain, unspecified: Secondary | ICD-10-CM

## 2020-10-15 NOTE — Telephone Encounter (Signed)
Copied from CRM [id number]. Topic: Quick Communication - Rx Refill/Question >> Oct 15, 2020  1:51 PM Pawlus, Maxine Glenn A wrote: Pts daughter stated the pts medication was spilled on, the medication is now "mush", caller wanted to know if Dr Sherrie Mustache could send in a new prescription for metoprolol succinate (TOPROL-XL) 25 MG 24 hr tablet, please advise if this is possible.

## 2020-10-15 NOTE — Telephone Encounter (Signed)
Tried calling patient's daughter. Left message to call back.

## 2020-10-16 NOTE — Telephone Encounter (Signed)
Would it be okay to refill her metoprolol early?   Thanks,    -Vernona Rieger

## 2020-10-17 ENCOUNTER — Other Ambulatory Visit: Payer: Self-pay | Admitting: Family Medicine

## 2020-10-17 DIAGNOSIS — R079 Chest pain, unspecified: Secondary | ICD-10-CM

## 2020-10-17 MED ORDER — METOPROLOL SUCCINATE ER 25 MG PO TB24
12.5000 mg | ORAL_TABLET | Freq: Every day | ORAL | 1 refills | Status: DC
Start: 2020-10-17 — End: 2021-01-12

## 2020-10-17 NOTE — Telephone Encounter (Signed)
That's fine. Please let the pharmacy know. Thanks!

## 2020-11-03 ENCOUNTER — Telehealth: Payer: Self-pay

## 2020-11-03 ENCOUNTER — Ambulatory Visit: Payer: Medicare Other | Admitting: Cardiology

## 2020-11-03 NOTE — Telephone Encounter (Signed)
Copied from CRM [id number]. Topic: General - Inquiry >> Nov 03, 2020  9:49 AM Daphine Deutscher D wrote: Reason for CRM:  Jasmin Roth called saying they were going to court tomorrow for Jasmin Roth to change guardianship and need an FL2 form filled out to place in assisted living.  CB#  [phone]

## 2020-11-20 NOTE — Telephone Encounter (Signed)
Rhesha with DSS called to check on the status of the FL2 form being signed.  She is needing to be placed in a nursing facility/  CB#  [phone]

## 2020-11-26 NOTE — Telephone Encounter (Signed)
FL-2 has been completed, signed and is left at my work station.

## 2020-11-26 NOTE — Telephone Encounter (Signed)
I called Jasmin Roth and left a detailed message on her voice message system that FL2 form is ready for pick up. Form has been placed up at the front desk.

## 2020-12-02 NOTE — Telephone Encounter (Signed)
Caller name: Saintclair Halsted  Relation to pt: from [facility]  Call back number: [phone] fax # [phone]    Reason for call:  Caller would like FL2 faxed to [phone] and will also pick forms up at 4:15pm.

## 2020-12-03 NOTE — Telephone Encounter (Signed)
Per Okey Regal, FL-2 paperwork was picked up from our office yesterday.

## 2020-12-11 ENCOUNTER — Telehealth: Payer: Self-pay

## 2020-12-11 NOTE — Telephone Encounter (Signed)
Unfortunately we cannot provide this information to the family care home. They will need to obtain this from Jasmin Roth family member or POA.

## 2020-12-11 NOTE — Telephone Encounter (Signed)
Copied from CRM [id number]. Topic: General - Call Back - No Documentation >> Dec 11, 2020 12:21 PM Randol Kern wrote: Reason for CRM: [facility] needs a copy of the patients medical information, they need her SSN, Medicare, and Medicaid cards for Rx refills. Please advise   Ask for Gilman Buttner or Woody Seller contact: [phone]

## 2020-12-25 ENCOUNTER — Telehealth: Payer: Self-pay

## 2020-12-25 NOTE — Telephone Encounter (Signed)
Copied from CRM [id number]. Topic: General - Other >> Dec 25, 2020  3:10 PM Marylen Ponto wrote: Reason for CRM: Rhesha with [facility] DSS requests updated FL2 form with Box 1 listing Dementia Box 11 should be Memory Care/Special Care Unit. Cb# [phone]  fax# [phone]

## 2021-01-01 ENCOUNTER — Telehealth: Payer: Self-pay

## 2021-01-01 NOTE — Telephone Encounter (Signed)
Appointment has been changed to 3pm on 01/12/2021. Will need to advise patients case worker Rhesha when she returns my call. OK for [facility] triage to speak with case worker.

## 2021-01-01 NOTE — Telephone Encounter (Signed)
Dr. Sherrie Mustache received order forms/ CMN from [facility] specialist for home medical equipment which included a seat lift chair/ mechanism, very low hospital bed, and safety belt. Per Dr. Sherrie Mustache, insurance requires office visit to document medical necessity. I called caregiver Lorely with [facility]. Fredrica advised me that Ms. Ellerman is her client, but I would need to speak with her social service case worker Rhesha whom is in charge of decision making. I tried calling Rhesha at [phone]). Her voice message system states that she is out of the office until 01/09/2020. Her voice message stated to call Irving Shows who would be covering her until she returned. I called Scott at [phone]) and left message to call back. OK for [facility] triage to speak with case worker when they return my call.   Patient has an appointment on 01/13/2020 to see Dr. Sherrie Mustache at 3:20pm for a 3 month follow up. I want to see if patient could come in at 3pm so that we can have enough time to address the follow up and the order forms.

## 2021-01-01 NOTE — Telephone Encounter (Signed)
Ok, that's a good idea.

## 2021-01-07 ENCOUNTER — Other Ambulatory Visit: Payer: Self-pay | Admitting: Family Medicine

## 2021-01-07 DIAGNOSIS — I10 Essential (primary) hypertension: Secondary | ICD-10-CM

## 2021-01-07 DIAGNOSIS — E876 Hypokalemia: Secondary | ICD-10-CM

## 2021-01-08 ENCOUNTER — Telehealth: Payer: Self-pay

## 2021-01-08 NOTE — Telephone Encounter (Signed)
Copied from CRM [id number]. Topic: General - Other >> Jan 08, 2021 10:25 AM Marylen Ponto wrote: Reason for CRM: [facility] DSS called for an update on updated FL2 form as patient needs a higher level of care. Cb# [phone]

## 2021-01-09 NOTE — Telephone Encounter (Signed)
FL-2 paperwork faxed with requested updates.

## 2021-01-12 ENCOUNTER — Other Ambulatory Visit: Payer: Self-pay

## 2021-01-12 ENCOUNTER — Encounter: Payer: Self-pay | Admitting: Family Medicine

## 2021-01-12 ENCOUNTER — Ambulatory Visit (INDEPENDENT_AMBULATORY_CARE_PROVIDER_SITE_OTHER): Payer: Medicare Other | Admitting: Family Medicine

## 2021-01-12 VITALS — BP 111/54 | HR 73 | Temp 98.5°F | Resp 18

## 2021-01-12 DIAGNOSIS — I10 Essential (primary) hypertension: Secondary | ICD-10-CM

## 2021-01-12 NOTE — Progress Notes (Unsigned)
Established patient visit   Patient: Jasmin Roth   DOB: 05/10/1933   88 y.o. Female  MRN: [id number] Visit Date: 01/12/2021  Today's healthcare provider: Lelon Huh, MD   Chief Complaint  Patient presents with   Hypertension   Subjective    HPI  Hypertension, follow-up  BP Readings from Last 3 Encounters:  01/12/21 (!) 111/54  10/07/20 (!) 184/75  10/03/20 (!) 178/77   Wt Readings from Last 3 Encounters:  10/07/20 99 lb (44.9 kg)  10/03/20 100 lb (45.4 kg)  05/12/20 107 lb (48.5 kg)     She was last seen for hypertension 3 months ago.  BP at that visit was 184/75. Management since that visit includes starting Aspirin 102m aspirin once a day and add metoprolol ER 248m1/2 tablet daily to her medications. She was also referred to Cardiology.   She reports good compliance with treatment. [place].  She is following a Regular diet. She is not exercising. She does not smoke.  Use of agents associated with hypertension: NSAIDS.   Outside blood pressures are not checked. Symptoms: No chest pain No chest pressure  No palpitations No syncope  No dyspnea No orthopnea  No paroxysmal nocturnal dyspnea No lower extremity edema   Pertinent labs: Lab Results  Component Value Date   CHOL 176 01/30/2019   HDL 66 01/30/2019   LDLCALC 97 01/30/2019   TRIG 71 01/30/2019   CHOLHDL 2.7 01/30/2019   Lab Results  Component Value Date   NA 141 05/12/2020   K 3.5 05/12/2020   CREATININE 0.91 05/12/2020   EGFR 62 05/12/2020   GLUCOSE 98 05/12/2020   TSH 1.620 05/12/2020     The ASCVD Risk score (Arnett DK, et al., 2019) failed to calculate for the following reasons:   The 2019 ASCVD risk score is only valid for ages 4030o 7914 ---------------------------------------------------------------------------------------------------   Medications: Outpatient Medications Prior to Visit   Medication Sig   amLODipine (NORVASC) 5 MG tablet TAKE 1 TABLET BY MOUTH DAILY   ASPIRIN LOW DOSE 81 MG EC tablet TAKE 1 TABLET BY MOUTH ONCE A DAY   FEROSUL 325 (65 Fe) MG tablet TAKE 1 TABLET BY MOUTH DAILY   ferrous sulfate 325 (65 FE) MG EC tablet Take 325 mg by mouth daily.    hydrochlorothiazide (HYDRODIURIL) 25 MG tablet TAKE 1 TABLET BY MOUTH DAILY   metoprolol succinate (TOPROL-XL) 25 MG 24 hr tablet Take 0.5 tablets (12.5 mg total) by mouth daily.   metoprolol succinate (TOPROL-XL) 25 MG 24 hr tablet TAKE 1/2 TABLET BY MOUTH ONCE DAILY   metoprolol tartrate (LOPRESSOR) 25 MG tablet TAKE 1/2 TABLET BY MOUTH DAILY   mupirocin cream (BACTROBAN) 2 % Apply 1 application topically 2 (two) times daily.   potassium chloride SA (KLOR-CON M) 20 MEQ tablet TAKE 1 TABLET BY MOUTH ONCE A DAY   valsartan (DIOVAN) 40 MG tablet TAKE 1 TABLET BY MOUTH ONCE DAILY   VITAMIN D PO Take by mouth.   No facility-administered medications prior to visit.    Review of Systems  Constitutional:  Negative for appetite change, chills, fatigue and fever.  Respiratory:  Negative for chest tightness and shortness of breath.   Cardiovascular:  Negative for chest pain and palpitations.  Gastrointestinal:  Negative for abdominal pain, nausea and vomiting.  Neurological:  Negative for dizziness and weakness.   {Labs  Heme   Chem   Endocrine   Serology   Results Review (optional):23779}   Objective    BP (!) 111/54 (BP Location: Right Arm, Patient Position: Sitting, Cuff Size: Normal)    Pulse 73    Temp 98.5 F (36.9 C) (Oral)    Resp 18    SpO2 100% Comment: room air {Show previous vital signs (optional):23777}  Today's Vitals   01/12/21 1501 01/12/21 1508  BP: (!) 113/49 (!) 111/54  Pulse: 73   Resp: 18   Temp: 98.5 F (36.9 C)   TempSrc: Oral   SpO2: 100%    There is no height or weight on file to calculate BMI.   Physical Exam  ***  No results found for any visits on 01/12/21.  Assessment &  Plan     ***  No follow-ups on file.      {provider attestation***:1}   Lelon Huh, MD  [facility] [phone] (phone) [phone] (fax)  [place]

## 2021-01-21 DIAGNOSIS — E876 Hypokalemia: Secondary | ICD-10-CM | POA: Diagnosis not present

## 2021-01-21 DIAGNOSIS — R269 Unspecified abnormalities of gait and mobility: Secondary | ICD-10-CM | POA: Diagnosis not present

## 2021-01-21 DIAGNOSIS — E782 Mixed hyperlipidemia: Secondary | ICD-10-CM | POA: Diagnosis not present

## 2021-01-21 DIAGNOSIS — G301 Alzheimer's disease with late onset: Secondary | ICD-10-CM | POA: Diagnosis not present

## 2021-01-21 DIAGNOSIS — D509 Iron deficiency anemia, unspecified: Secondary | ICD-10-CM | POA: Diagnosis not present

## 2021-01-21 DIAGNOSIS — I10 Essential (primary) hypertension: Secondary | ICD-10-CM | POA: Diagnosis not present

## 2021-01-26 DIAGNOSIS — G301 Alzheimer's disease with late onset: Secondary | ICD-10-CM | POA: Diagnosis not present

## 2021-01-27 ENCOUNTER — Ambulatory Visit: Payer: Self-pay | Admitting: *Deleted

## 2021-01-27 NOTE — Telephone Encounter (Signed)
°  Chief Complaint: Altered Mental Status at times, varies, not new but worsening. Symptoms:  Some confusion at facility when speaking with SW. Daughter states "Cleared up when back to room." Frequency: Yesterday Pertinent Negatives: Patient denies No new meds, no UTI S/S, no infectious S/S Disposition: [] ED /[] Urgent Care (no appt availability in office) / [x] Appointment(In office/virtual)/ []  [facility]/ [] Home Care/ [] Refused Recommended Disposition /[] [facility]/ []  Follow-up with PCP Additional Notes: Pt  in Memory Unit at [facility] Moved there 01/16/21.       Reason for Disposition  New or worsening memory (forgetfulness) problems  Answer Assessment - Initial Assessment Questions 1. MAIN CONCERN OR SYMPTOM:  "What is your main concern right now?" "What questions do you have?" "What's the main symptom you're worried about?" (e.g., confusion, memory loss)     Changes in mental status 2. ONSET:  "When did the symptom start (or worsen)?" (minutes, hours, days, weeks)     Yesterday 3. BETTER-SAME-WORSE: "Are you (the patient) getting better, staying the same, or getting worse compared to the day you (they) were diagnosed or most recent hospital discharge ?"     Varies 4. DIAGNOSIS: "Was the dementia diagnosed by a doctor?" If Yes, ask: "When?" (e.g., days, months, years ago)     Yes 5. MEDICATION: "Has there been any change in medicines recently?" (e.g., narcotics, antihistamines, benzodiazepines, etc.)     No 6. OTHER SYMPTOMS: "Are there any other symptoms?" (e.g., fever, cough, pain, falling)     None known 7. SUPPORT: Document living circumstances and support (e.g., family, nursing home)     [facility], memory care unit. 01/16/21  to this new place.  Protocols used: Dementia Symptoms and Questions-A-AH

## 2021-01-27 NOTE — Telephone Encounter (Signed)
Summary: questions about moms dementia   Pt's daughter called upset because she went to visit her mother yesterday and there was a Radio producer there asking her mother questions and mom told the social worker that she had never been married or have any children but later she did know them by name.  Jasmin Roth her daughter is concerned about the progression of her mothers dementia.   She would like Dr Sherrie Mustache to know and also wants to know if there are any test or medications that she needs to be on.  She was caught by surprise because mom had always known them before.   CB#  [phone]     Attempted to call daughter- left message to call office on VM.

## 2021-01-27 NOTE — Telephone Encounter (Signed)
Daughter, Westley Hummer, calling back. Concerned because "the nurse at [facility] said that Mom has Alzheimer's, and I have not been told that." Would like clarification from Dr. Sherrie Mustache.

## 2021-02-01 NOTE — Progress Notes (Deleted)
New Outpatient Visit Date: 02/05/2021  Referring Provider: Birdie Sons, MD [address] [place],  [place] [address]  Chief Complaint: ***  HPI:  Jasmin Roth is a 88 y.o. female who is being seen today for the evaluation of chest pain at the request of Dr. Caryn Section. She has a history of hypertension. She reported an episode of chest pain in early 10/2020  --------------------------------------------------------------------------------------------------  Cardiovascular History & Procedures: Cardiovascular Problems: Chest pain  Risk Factors: Hypertension and age > 95.   Cath/PCI: None  CV Surgery: None  EP Procedures and Devices: None  Non-Invasive Evaluation(s): Bilateral lower extremity venous Duplex (08/08/2008): No evidence of DVT in either lower extremity.  Recent CV Pertinent Labs: Lab Results  Component Value Date   CHOL 176 01/30/2019   CHOL 133 08/30/2011   HDL 66 01/30/2019   HDL 47 08/30/2011   LDLCALC 97 01/30/2019   LDLCALC 70 08/30/2011   TRIG 71 01/30/2019   TRIG 78 08/30/2011   CHOLHDL 2.7 01/30/2019   K 3.5 05/12/2020   K 3.8 09/01/2011   MG 2.2 03/05/2019   BUN 18 05/12/2020   BUN 19 (H) 09/01/2011   CREATININE 0.91 05/12/2020   CREATININE 0.66 09/01/2011    --------------------------------------------------------------------------------------------------  Past Medical History:  Diagnosis Date   Hypertension     Past Surgical History:  Procedure Laterality Date   CESAREAN SECTION     ELBOW LIGAMENT RECONSTRUCTION     TONSILLECTOMY AND ADENOIDECTOMY      No outpatient medications have been marked as taking for the 02/05/21 encounter (Appointment) with Teana Lindahl, Harrell Gave, MD.    Allergies: Patient has no known allergies.  Social History   Tobacco Use   Smoking status: Never   Smokeless tobacco: Never  Substance Use Topics   Alcohol use: No   Drug use: No    Family History  Problem Relation Age of Onset    Hypertension Mother    Heart failure Mother    Kidney failure Father     Review of Systems: A 12-system review of systems was performed and was negative except as noted in the HPI.  --------------------------------------------------------------------------------------------------  Physical Exam: There were no vitals taken for this visit.  General:  *** HEENT: No conjunctival pallor or scleral icterus. Facemask in place. Neck: Supple without lymphadenopathy, thyromegaly, JVD, or HJR. No carotid bruit. Lungs: Normal work of breathing. Clear to auscultation bilaterally without wheezes or crackles. Heart: Regular rate and rhythm without murmurs, rubs, or gallops. Non-displaced PMI. Abd: Bowel sounds present. Soft, NT/ND without hepatosplenomegaly Ext: No lower extremity edema. Radial, PT, and DP pulses are 2+ bilaterally Skin: Warm and dry without rash. Neuro: CNIII-XII intact. Strength and fine-touch sensation intact in upper and lower extremities bilaterally. Psych: Normal mood and affect.  EKG:  ***  Lab Results  Component Value Date   WBC 7.9 05/12/2020   HGB 10.6 (L) 05/12/2020   HCT 30.6 (L) 05/12/2020   MCV 89 05/12/2020   PLT 241 05/12/2020    Lab Results  Component Value Date   NA 141 05/12/2020   K 3.5 05/12/2020   CL 104 05/12/2020   CO2 21 05/12/2020   BUN 18 05/12/2020   CREATININE 0.91 05/12/2020   GLUCOSE 98 05/12/2020   ALT 5 05/12/2020    Lab Results  Component Value Date   CHOL 176 01/30/2019   HDL 66 01/30/2019   LDLCALC 97 01/30/2019   TRIG 71 01/30/2019   CHOLHDL 2.7 01/30/2019     --------------------------------------------------------------------------------------------------  ASSESSMENT AND PLAN: Nelva Bush, MD 02/01/2021 2:18 PM

## 2021-02-05 ENCOUNTER — Ambulatory Visit: Payer: Medicare Other | Admitting: Internal Medicine

## 2021-02-09 ENCOUNTER — Ambulatory Visit: Payer: Medicare Other | Admitting: Family Medicine

## 2021-02-09 DIAGNOSIS — B351 Tinea unguium: Secondary | ICD-10-CM | POA: Diagnosis not present

## 2021-02-09 DIAGNOSIS — I739 Peripheral vascular disease, unspecified: Secondary | ICD-10-CM | POA: Diagnosis not present

## 2021-02-13 DIAGNOSIS — Z79899 Other long term (current) drug therapy: Secondary | ICD-10-CM | POA: Diagnosis not present

## 2021-02-18 DIAGNOSIS — G301 Alzheimer's disease with late onset: Secondary | ICD-10-CM | POA: Diagnosis not present

## 2021-02-18 DIAGNOSIS — I129 Hypertensive chronic kidney disease with stage 1 through stage 4 chronic kidney disease, or unspecified chronic kidney disease: Secondary | ICD-10-CM | POA: Diagnosis not present

## 2021-02-18 DIAGNOSIS — R634 Abnormal weight loss: Secondary | ICD-10-CM | POA: Diagnosis not present

## 2021-02-18 DIAGNOSIS — N1831 Chronic kidney disease, stage 3a: Secondary | ICD-10-CM | POA: Diagnosis not present

## 2021-02-18 DIAGNOSIS — R269 Unspecified abnormalities of gait and mobility: Secondary | ICD-10-CM | POA: Diagnosis not present

## 2021-02-18 DIAGNOSIS — D509 Iron deficiency anemia, unspecified: Secondary | ICD-10-CM | POA: Diagnosis not present

## 2021-02-18 DIAGNOSIS — E876 Hypokalemia: Secondary | ICD-10-CM | POA: Diagnosis not present

## 2021-02-23 DIAGNOSIS — N1831 Chronic kidney disease, stage 3a: Secondary | ICD-10-CM | POA: Diagnosis not present

## 2021-02-23 DIAGNOSIS — R269 Unspecified abnormalities of gait and mobility: Secondary | ICD-10-CM | POA: Diagnosis not present

## 2021-02-23 DIAGNOSIS — G301 Alzheimer's disease with late onset: Secondary | ICD-10-CM | POA: Diagnosis not present

## 2021-02-23 DIAGNOSIS — M6281 Muscle weakness (generalized): Secondary | ICD-10-CM | POA: Diagnosis not present

## 2021-02-23 DIAGNOSIS — R262 Difficulty in walking, not elsewhere classified: Secondary | ICD-10-CM | POA: Diagnosis not present

## 2021-02-23 DIAGNOSIS — R1311 Dysphagia, oral phase: Secondary | ICD-10-CM | POA: Diagnosis not present

## 2021-02-23 DIAGNOSIS — R634 Abnormal weight loss: Secondary | ICD-10-CM | POA: Diagnosis not present

## 2021-02-24 DIAGNOSIS — G301 Alzheimer's disease with late onset: Secondary | ICD-10-CM | POA: Diagnosis not present

## 2021-02-24 DIAGNOSIS — M6281 Muscle weakness (generalized): Secondary | ICD-10-CM | POA: Diagnosis not present

## 2021-02-24 DIAGNOSIS — R1311 Dysphagia, oral phase: Secondary | ICD-10-CM | POA: Diagnosis not present

## 2021-02-24 DIAGNOSIS — N1831 Chronic kidney disease, stage 3a: Secondary | ICD-10-CM | POA: Diagnosis not present

## 2021-02-25 DIAGNOSIS — R262 Difficulty in walking, not elsewhere classified: Secondary | ICD-10-CM | POA: Diagnosis not present

## 2021-02-25 DIAGNOSIS — R269 Unspecified abnormalities of gait and mobility: Secondary | ICD-10-CM | POA: Diagnosis not present

## 2021-02-25 DIAGNOSIS — G301 Alzheimer's disease with late onset: Secondary | ICD-10-CM | POA: Diagnosis not present

## 2021-02-25 DIAGNOSIS — M6281 Muscle weakness (generalized): Secondary | ICD-10-CM | POA: Diagnosis not present

## 2021-02-25 DIAGNOSIS — R634 Abnormal weight loss: Secondary | ICD-10-CM | POA: Diagnosis not present

## 2021-02-25 DIAGNOSIS — R1311 Dysphagia, oral phase: Secondary | ICD-10-CM | POA: Diagnosis not present

## 2021-02-26 DIAGNOSIS — R1311 Dysphagia, oral phase: Secondary | ICD-10-CM | POA: Diagnosis not present

## 2021-02-26 DIAGNOSIS — G301 Alzheimer's disease with late onset: Secondary | ICD-10-CM | POA: Diagnosis not present

## 2021-02-26 DIAGNOSIS — M6281 Muscle weakness (generalized): Secondary | ICD-10-CM | POA: Diagnosis not present

## 2021-02-27 DIAGNOSIS — R634 Abnormal weight loss: Secondary | ICD-10-CM | POA: Diagnosis not present

## 2021-02-27 DIAGNOSIS — N1831 Chronic kidney disease, stage 3a: Secondary | ICD-10-CM | POA: Diagnosis not present

## 2021-02-27 DIAGNOSIS — M6281 Muscle weakness (generalized): Secondary | ICD-10-CM | POA: Diagnosis not present

## 2021-02-27 DIAGNOSIS — R262 Difficulty in walking, not elsewhere classified: Secondary | ICD-10-CM | POA: Diagnosis not present

## 2021-02-27 DIAGNOSIS — R1311 Dysphagia, oral phase: Secondary | ICD-10-CM | POA: Diagnosis not present

## 2021-02-27 DIAGNOSIS — G301 Alzheimer's disease with late onset: Secondary | ICD-10-CM | POA: Diagnosis not present

## 2021-02-27 DIAGNOSIS — R269 Unspecified abnormalities of gait and mobility: Secondary | ICD-10-CM | POA: Diagnosis not present

## 2021-03-02 DIAGNOSIS — G301 Alzheimer's disease with late onset: Secondary | ICD-10-CM | POA: Diagnosis not present

## 2021-03-02 DIAGNOSIS — R1311 Dysphagia, oral phase: Secondary | ICD-10-CM | POA: Diagnosis not present

## 2021-03-02 DIAGNOSIS — M6281 Muscle weakness (generalized): Secondary | ICD-10-CM | POA: Diagnosis not present

## 2021-03-02 DIAGNOSIS — R269 Unspecified abnormalities of gait and mobility: Secondary | ICD-10-CM | POA: Diagnosis not present

## 2021-03-02 DIAGNOSIS — R262 Difficulty in walking, not elsewhere classified: Secondary | ICD-10-CM | POA: Diagnosis not present

## 2021-03-02 DIAGNOSIS — R634 Abnormal weight loss: Secondary | ICD-10-CM | POA: Diagnosis not present

## 2021-03-03 DIAGNOSIS — G301 Alzheimer's disease with late onset: Secondary | ICD-10-CM | POA: Diagnosis not present

## 2021-03-03 DIAGNOSIS — E876 Hypokalemia: Secondary | ICD-10-CM | POA: Diagnosis not present

## 2021-03-03 DIAGNOSIS — R634 Abnormal weight loss: Secondary | ICD-10-CM | POA: Diagnosis not present

## 2021-03-03 DIAGNOSIS — M6281 Muscle weakness (generalized): Secondary | ICD-10-CM | POA: Diagnosis not present

## 2021-03-03 DIAGNOSIS — D509 Iron deficiency anemia, unspecified: Secondary | ICD-10-CM | POA: Diagnosis not present

## 2021-03-03 DIAGNOSIS — R1311 Dysphagia, oral phase: Secondary | ICD-10-CM | POA: Diagnosis not present

## 2021-03-03 DIAGNOSIS — E440 Moderate protein-calorie malnutrition: Secondary | ICD-10-CM | POA: Diagnosis not present

## 2021-03-03 DIAGNOSIS — R269 Unspecified abnormalities of gait and mobility: Secondary | ICD-10-CM | POA: Diagnosis not present

## 2021-03-03 DIAGNOSIS — N1831 Chronic kidney disease, stage 3a: Secondary | ICD-10-CM | POA: Diagnosis not present

## 2021-03-03 DIAGNOSIS — R262 Difficulty in walking, not elsewhere classified: Secondary | ICD-10-CM | POA: Diagnosis not present

## 2021-03-03 DIAGNOSIS — I10 Essential (primary) hypertension: Secondary | ICD-10-CM | POA: Diagnosis not present

## 2021-03-04 DIAGNOSIS — N1831 Chronic kidney disease, stage 3a: Secondary | ICD-10-CM | POA: Diagnosis not present

## 2021-03-04 DIAGNOSIS — M6281 Muscle weakness (generalized): Secondary | ICD-10-CM | POA: Diagnosis not present

## 2021-03-04 DIAGNOSIS — R634 Abnormal weight loss: Secondary | ICD-10-CM | POA: Diagnosis not present

## 2021-03-04 DIAGNOSIS — G301 Alzheimer's disease with late onset: Secondary | ICD-10-CM | POA: Diagnosis not present

## 2021-03-04 DIAGNOSIS — R262 Difficulty in walking, not elsewhere classified: Secondary | ICD-10-CM | POA: Diagnosis not present

## 2021-03-04 DIAGNOSIS — R269 Unspecified abnormalities of gait and mobility: Secondary | ICD-10-CM | POA: Diagnosis not present

## 2021-03-05 ENCOUNTER — Ambulatory Visit: Payer: Medicare Other | Admitting: Cardiology

## 2021-03-06 DIAGNOSIS — N1831 Chronic kidney disease, stage 3a: Secondary | ICD-10-CM | POA: Diagnosis not present

## 2021-03-06 DIAGNOSIS — R262 Difficulty in walking, not elsewhere classified: Secondary | ICD-10-CM | POA: Diagnosis not present

## 2021-03-06 DIAGNOSIS — G301 Alzheimer's disease with late onset: Secondary | ICD-10-CM | POA: Diagnosis not present

## 2021-03-06 DIAGNOSIS — R269 Unspecified abnormalities of gait and mobility: Secondary | ICD-10-CM | POA: Diagnosis not present

## 2021-03-06 DIAGNOSIS — M6281 Muscle weakness (generalized): Secondary | ICD-10-CM | POA: Diagnosis not present

## 2021-03-06 DIAGNOSIS — R634 Abnormal weight loss: Secondary | ICD-10-CM | POA: Diagnosis not present

## 2021-03-07 DIAGNOSIS — R634 Abnormal weight loss: Secondary | ICD-10-CM | POA: Diagnosis not present

## 2021-03-07 DIAGNOSIS — G301 Alzheimer's disease with late onset: Secondary | ICD-10-CM | POA: Diagnosis not present

## 2021-03-07 DIAGNOSIS — M6281 Muscle weakness (generalized): Secondary | ICD-10-CM | POA: Diagnosis not present

## 2021-03-08 DIAGNOSIS — R634 Abnormal weight loss: Secondary | ICD-10-CM | POA: Diagnosis not present

## 2021-03-08 DIAGNOSIS — M6281 Muscle weakness (generalized): Secondary | ICD-10-CM | POA: Diagnosis not present

## 2021-03-08 DIAGNOSIS — G301 Alzheimer's disease with late onset: Secondary | ICD-10-CM | POA: Diagnosis not present

## 2021-03-10 DIAGNOSIS — R262 Difficulty in walking, not elsewhere classified: Secondary | ICD-10-CM | POA: Diagnosis not present

## 2021-03-10 DIAGNOSIS — M6281 Muscle weakness (generalized): Secondary | ICD-10-CM | POA: Diagnosis not present

## 2021-03-10 DIAGNOSIS — R269 Unspecified abnormalities of gait and mobility: Secondary | ICD-10-CM | POA: Diagnosis not present

## 2021-03-10 DIAGNOSIS — R634 Abnormal weight loss: Secondary | ICD-10-CM | POA: Diagnosis not present

## 2021-03-10 DIAGNOSIS — G301 Alzheimer's disease with late onset: Secondary | ICD-10-CM | POA: Diagnosis not present

## 2021-03-11 DIAGNOSIS — N1831 Chronic kidney disease, stage 3a: Secondary | ICD-10-CM | POA: Diagnosis not present

## 2021-03-11 DIAGNOSIS — R262 Difficulty in walking, not elsewhere classified: Secondary | ICD-10-CM | POA: Diagnosis not present

## 2021-03-11 DIAGNOSIS — G301 Alzheimer's disease with late onset: Secondary | ICD-10-CM | POA: Diagnosis not present

## 2021-03-11 DIAGNOSIS — M6281 Muscle weakness (generalized): Secondary | ICD-10-CM | POA: Diagnosis not present

## 2021-03-11 DIAGNOSIS — R634 Abnormal weight loss: Secondary | ICD-10-CM | POA: Diagnosis not present

## 2021-03-11 DIAGNOSIS — R269 Unspecified abnormalities of gait and mobility: Secondary | ICD-10-CM | POA: Diagnosis not present

## 2021-03-12 DIAGNOSIS — N1831 Chronic kidney disease, stage 3a: Secondary | ICD-10-CM | POA: Diagnosis not present

## 2021-03-12 DIAGNOSIS — M6281 Muscle weakness (generalized): Secondary | ICD-10-CM | POA: Diagnosis not present

## 2021-03-12 DIAGNOSIS — R634 Abnormal weight loss: Secondary | ICD-10-CM | POA: Diagnosis not present

## 2021-03-12 DIAGNOSIS — R269 Unspecified abnormalities of gait and mobility: Secondary | ICD-10-CM | POA: Diagnosis not present

## 2021-03-12 DIAGNOSIS — G301 Alzheimer's disease with late onset: Secondary | ICD-10-CM | POA: Diagnosis not present

## 2021-03-12 DIAGNOSIS — R262 Difficulty in walking, not elsewhere classified: Secondary | ICD-10-CM | POA: Diagnosis not present

## 2021-03-13 DIAGNOSIS — M6281 Muscle weakness (generalized): Secondary | ICD-10-CM | POA: Diagnosis not present

## 2021-03-13 DIAGNOSIS — R634 Abnormal weight loss: Secondary | ICD-10-CM | POA: Diagnosis not present

## 2021-03-13 DIAGNOSIS — G301 Alzheimer's disease with late onset: Secondary | ICD-10-CM | POA: Diagnosis not present

## 2021-03-13 DIAGNOSIS — N1831 Chronic kidney disease, stage 3a: Secondary | ICD-10-CM | POA: Diagnosis not present

## 2021-03-16 DIAGNOSIS — N1831 Chronic kidney disease, stage 3a: Secondary | ICD-10-CM | POA: Diagnosis not present

## 2021-03-16 DIAGNOSIS — G301 Alzheimer's disease with late onset: Secondary | ICD-10-CM | POA: Diagnosis not present

## 2021-03-16 DIAGNOSIS — M6281 Muscle weakness (generalized): Secondary | ICD-10-CM | POA: Diagnosis not present

## 2021-03-16 DIAGNOSIS — R269 Unspecified abnormalities of gait and mobility: Secondary | ICD-10-CM | POA: Diagnosis not present

## 2021-03-16 DIAGNOSIS — R634 Abnormal weight loss: Secondary | ICD-10-CM | POA: Diagnosis not present

## 2021-03-16 DIAGNOSIS — R262 Difficulty in walking, not elsewhere classified: Secondary | ICD-10-CM | POA: Diagnosis not present

## 2021-03-17 DIAGNOSIS — R634 Abnormal weight loss: Secondary | ICD-10-CM | POA: Diagnosis not present

## 2021-03-17 DIAGNOSIS — M6281 Muscle weakness (generalized): Secondary | ICD-10-CM | POA: Diagnosis not present

## 2021-03-17 DIAGNOSIS — G301 Alzheimer's disease with late onset: Secondary | ICD-10-CM | POA: Diagnosis not present

## 2021-03-18 DIAGNOSIS — G301 Alzheimer's disease with late onset: Secondary | ICD-10-CM | POA: Diagnosis not present

## 2021-03-18 DIAGNOSIS — R634 Abnormal weight loss: Secondary | ICD-10-CM | POA: Diagnosis not present

## 2021-03-18 DIAGNOSIS — R269 Unspecified abnormalities of gait and mobility: Secondary | ICD-10-CM | POA: Diagnosis not present

## 2021-03-18 DIAGNOSIS — N1831 Chronic kidney disease, stage 3a: Secondary | ICD-10-CM | POA: Diagnosis not present

## 2021-03-18 DIAGNOSIS — E440 Moderate protein-calorie malnutrition: Secondary | ICD-10-CM | POA: Diagnosis not present

## 2021-03-18 DIAGNOSIS — D509 Iron deficiency anemia, unspecified: Secondary | ICD-10-CM | POA: Diagnosis not present

## 2021-03-18 DIAGNOSIS — I129 Hypertensive chronic kidney disease with stage 1 through stage 4 chronic kidney disease, or unspecified chronic kidney disease: Secondary | ICD-10-CM | POA: Diagnosis not present

## 2021-03-18 DIAGNOSIS — M6281 Muscle weakness (generalized): Secondary | ICD-10-CM | POA: Diagnosis not present

## 2021-03-18 DIAGNOSIS — R195 Other fecal abnormalities: Secondary | ICD-10-CM | POA: Diagnosis not present

## 2021-03-19 DIAGNOSIS — R269 Unspecified abnormalities of gait and mobility: Secondary | ICD-10-CM | POA: Diagnosis not present

## 2021-03-19 DIAGNOSIS — G301 Alzheimer's disease with late onset: Secondary | ICD-10-CM | POA: Diagnosis not present

## 2021-03-19 DIAGNOSIS — R262 Difficulty in walking, not elsewhere classified: Secondary | ICD-10-CM | POA: Diagnosis not present

## 2021-03-19 DIAGNOSIS — R634 Abnormal weight loss: Secondary | ICD-10-CM | POA: Diagnosis not present

## 2021-03-19 DIAGNOSIS — M6281 Muscle weakness (generalized): Secondary | ICD-10-CM | POA: Diagnosis not present

## 2021-03-19 DIAGNOSIS — N1831 Chronic kidney disease, stage 3a: Secondary | ICD-10-CM | POA: Diagnosis not present

## 2021-03-20 DIAGNOSIS — G301 Alzheimer's disease with late onset: Secondary | ICD-10-CM | POA: Diagnosis not present

## 2021-03-20 DIAGNOSIS — R634 Abnormal weight loss: Secondary | ICD-10-CM | POA: Diagnosis not present

## 2021-03-20 DIAGNOSIS — M6281 Muscle weakness (generalized): Secondary | ICD-10-CM | POA: Diagnosis not present

## 2021-03-23 DIAGNOSIS — G301 Alzheimer's disease with late onset: Secondary | ICD-10-CM | POA: Diagnosis not present

## 2021-03-23 DIAGNOSIS — R262 Difficulty in walking, not elsewhere classified: Secondary | ICD-10-CM | POA: Diagnosis not present

## 2021-03-23 DIAGNOSIS — R269 Unspecified abnormalities of gait and mobility: Secondary | ICD-10-CM | POA: Diagnosis not present

## 2021-03-23 DIAGNOSIS — M6281 Muscle weakness (generalized): Secondary | ICD-10-CM | POA: Diagnosis not present

## 2021-03-23 DIAGNOSIS — R634 Abnormal weight loss: Secondary | ICD-10-CM | POA: Diagnosis not present

## 2021-03-24 DIAGNOSIS — M6281 Muscle weakness (generalized): Secondary | ICD-10-CM | POA: Diagnosis not present

## 2021-03-24 DIAGNOSIS — R262 Difficulty in walking, not elsewhere classified: Secondary | ICD-10-CM | POA: Diagnosis not present

## 2021-03-24 DIAGNOSIS — G301 Alzheimer's disease with late onset: Secondary | ICD-10-CM | POA: Diagnosis not present

## 2021-03-24 DIAGNOSIS — R269 Unspecified abnormalities of gait and mobility: Secondary | ICD-10-CM | POA: Diagnosis not present

## 2021-03-24 DIAGNOSIS — R634 Abnormal weight loss: Secondary | ICD-10-CM | POA: Diagnosis not present

## 2021-03-25 DIAGNOSIS — D509 Iron deficiency anemia, unspecified: Secondary | ICD-10-CM | POA: Diagnosis not present

## 2021-03-25 DIAGNOSIS — N1831 Chronic kidney disease, stage 3a: Secondary | ICD-10-CM | POA: Diagnosis not present

## 2021-03-25 DIAGNOSIS — M6281 Muscle weakness (generalized): Secondary | ICD-10-CM | POA: Diagnosis not present

## 2021-03-25 DIAGNOSIS — R269 Unspecified abnormalities of gait and mobility: Secondary | ICD-10-CM | POA: Diagnosis not present

## 2021-03-25 DIAGNOSIS — R634 Abnormal weight loss: Secondary | ICD-10-CM | POA: Diagnosis not present

## 2021-03-25 DIAGNOSIS — R262 Difficulty in walking, not elsewhere classified: Secondary | ICD-10-CM | POA: Diagnosis not present

## 2021-03-25 DIAGNOSIS — G301 Alzheimer's disease with late onset: Secondary | ICD-10-CM | POA: Diagnosis not present

## 2021-03-25 DIAGNOSIS — R062 Wheezing: Secondary | ICD-10-CM | POA: Diagnosis not present

## 2021-03-26 DIAGNOSIS — N1831 Chronic kidney disease, stage 3a: Secondary | ICD-10-CM | POA: Diagnosis not present

## 2021-03-26 DIAGNOSIS — G301 Alzheimer's disease with late onset: Secondary | ICD-10-CM | POA: Diagnosis not present

## 2021-03-26 DIAGNOSIS — M6281 Muscle weakness (generalized): Secondary | ICD-10-CM | POA: Diagnosis not present

## 2021-03-26 DIAGNOSIS — D649 Anemia, unspecified: Secondary | ICD-10-CM | POA: Diagnosis not present

## 2021-03-26 DIAGNOSIS — R634 Abnormal weight loss: Secondary | ICD-10-CM | POA: Diagnosis not present

## 2021-03-27 DIAGNOSIS — M6281 Muscle weakness (generalized): Secondary | ICD-10-CM | POA: Diagnosis not present

## 2021-03-27 DIAGNOSIS — R634 Abnormal weight loss: Secondary | ICD-10-CM | POA: Diagnosis not present

## 2021-03-27 DIAGNOSIS — N1831 Chronic kidney disease, stage 3a: Secondary | ICD-10-CM | POA: Diagnosis not present

## 2021-03-27 DIAGNOSIS — G301 Alzheimer's disease with late onset: Secondary | ICD-10-CM | POA: Diagnosis not present

## 2021-03-27 DIAGNOSIS — R262 Difficulty in walking, not elsewhere classified: Secondary | ICD-10-CM | POA: Diagnosis not present

## 2021-03-27 DIAGNOSIS — R269 Unspecified abnormalities of gait and mobility: Secondary | ICD-10-CM | POA: Diagnosis not present

## 2021-03-30 DIAGNOSIS — R269 Unspecified abnormalities of gait and mobility: Secondary | ICD-10-CM | POA: Diagnosis not present

## 2021-03-30 DIAGNOSIS — R262 Difficulty in walking, not elsewhere classified: Secondary | ICD-10-CM | POA: Diagnosis not present

## 2021-03-30 DIAGNOSIS — N1831 Chronic kidney disease, stage 3a: Secondary | ICD-10-CM | POA: Diagnosis not present

## 2021-03-30 DIAGNOSIS — M6281 Muscle weakness (generalized): Secondary | ICD-10-CM | POA: Diagnosis not present

## 2021-03-30 DIAGNOSIS — R634 Abnormal weight loss: Secondary | ICD-10-CM | POA: Diagnosis not present

## 2021-03-30 DIAGNOSIS — G301 Alzheimer's disease with late onset: Secondary | ICD-10-CM | POA: Diagnosis not present

## 2021-03-31 DIAGNOSIS — R634 Abnormal weight loss: Secondary | ICD-10-CM | POA: Diagnosis not present

## 2021-03-31 DIAGNOSIS — G301 Alzheimer's disease with late onset: Secondary | ICD-10-CM | POA: Diagnosis not present

## 2021-03-31 DIAGNOSIS — M6281 Muscle weakness (generalized): Secondary | ICD-10-CM | POA: Diagnosis not present

## 2021-04-01 DIAGNOSIS — D509 Iron deficiency anemia, unspecified: Secondary | ICD-10-CM | POA: Diagnosis not present

## 2021-04-01 DIAGNOSIS — R062 Wheezing: Secondary | ICD-10-CM | POA: Diagnosis not present

## 2021-04-01 DIAGNOSIS — R269 Unspecified abnormalities of gait and mobility: Secondary | ICD-10-CM | POA: Diagnosis not present

## 2021-04-01 DIAGNOSIS — R634 Abnormal weight loss: Secondary | ICD-10-CM | POA: Diagnosis not present

## 2021-04-01 DIAGNOSIS — G301 Alzheimer's disease with late onset: Secondary | ICD-10-CM | POA: Diagnosis not present

## 2021-04-01 DIAGNOSIS — R262 Difficulty in walking, not elsewhere classified: Secondary | ICD-10-CM | POA: Diagnosis not present

## 2021-04-01 DIAGNOSIS — N1831 Chronic kidney disease, stage 3a: Secondary | ICD-10-CM | POA: Diagnosis not present

## 2021-04-01 DIAGNOSIS — M6281 Muscle weakness (generalized): Secondary | ICD-10-CM | POA: Diagnosis not present

## 2021-04-02 DIAGNOSIS — M6281 Muscle weakness (generalized): Secondary | ICD-10-CM | POA: Diagnosis not present

## 2021-04-02 DIAGNOSIS — G301 Alzheimer's disease with late onset: Secondary | ICD-10-CM | POA: Diagnosis not present

## 2021-04-02 DIAGNOSIS — R634 Abnormal weight loss: Secondary | ICD-10-CM | POA: Diagnosis not present

## 2021-04-03 DIAGNOSIS — R634 Abnormal weight loss: Secondary | ICD-10-CM | POA: Diagnosis not present

## 2021-04-03 DIAGNOSIS — M6281 Muscle weakness (generalized): Secondary | ICD-10-CM | POA: Diagnosis not present

## 2021-04-03 DIAGNOSIS — G301 Alzheimer's disease with late onset: Secondary | ICD-10-CM | POA: Diagnosis not present

## 2021-04-04 DIAGNOSIS — M6281 Muscle weakness (generalized): Secondary | ICD-10-CM | POA: Diagnosis not present

## 2021-04-04 DIAGNOSIS — R634 Abnormal weight loss: Secondary | ICD-10-CM | POA: Diagnosis not present

## 2021-04-04 DIAGNOSIS — N1831 Chronic kidney disease, stage 3a: Secondary | ICD-10-CM | POA: Diagnosis not present

## 2021-04-04 DIAGNOSIS — R269 Unspecified abnormalities of gait and mobility: Secondary | ICD-10-CM | POA: Diagnosis not present

## 2021-04-04 DIAGNOSIS — G301 Alzheimer's disease with late onset: Secondary | ICD-10-CM | POA: Diagnosis not present

## 2021-04-06 ENCOUNTER — Ambulatory Visit: Payer: Medicare Other | Admitting: Cardiology

## 2021-04-06 DIAGNOSIS — R262 Difficulty in walking, not elsewhere classified: Secondary | ICD-10-CM | POA: Diagnosis not present

## 2021-04-06 DIAGNOSIS — R269 Unspecified abnormalities of gait and mobility: Secondary | ICD-10-CM | POA: Diagnosis not present

## 2021-04-06 DIAGNOSIS — G301 Alzheimer's disease with late onset: Secondary | ICD-10-CM | POA: Diagnosis not present

## 2021-04-06 DIAGNOSIS — R634 Abnormal weight loss: Secondary | ICD-10-CM | POA: Diagnosis not present

## 2021-04-06 DIAGNOSIS — M6281 Muscle weakness (generalized): Secondary | ICD-10-CM | POA: Diagnosis not present

## 2021-04-06 DIAGNOSIS — N1831 Chronic kidney disease, stage 3a: Secondary | ICD-10-CM | POA: Diagnosis not present

## 2021-04-07 ENCOUNTER — Encounter: Payer: Self-pay | Admitting: Cardiology

## 2021-04-07 DIAGNOSIS — M6281 Muscle weakness (generalized): Secondary | ICD-10-CM | POA: Diagnosis not present

## 2021-04-07 DIAGNOSIS — R634 Abnormal weight loss: Secondary | ICD-10-CM | POA: Diagnosis not present

## 2021-04-07 DIAGNOSIS — G301 Alzheimer's disease with late onset: Secondary | ICD-10-CM | POA: Diagnosis not present

## 2021-04-08 DIAGNOSIS — N1831 Chronic kidney disease, stage 3a: Secondary | ICD-10-CM | POA: Diagnosis not present

## 2021-04-08 DIAGNOSIS — G301 Alzheimer's disease with late onset: Secondary | ICD-10-CM | POA: Diagnosis not present

## 2021-04-08 DIAGNOSIS — R634 Abnormal weight loss: Secondary | ICD-10-CM | POA: Diagnosis not present

## 2021-04-08 DIAGNOSIS — M6281 Muscle weakness (generalized): Secondary | ICD-10-CM | POA: Diagnosis not present

## 2021-04-09 DIAGNOSIS — G301 Alzheimer's disease with late onset: Secondary | ICD-10-CM | POA: Diagnosis not present

## 2021-04-09 DIAGNOSIS — R634 Abnormal weight loss: Secondary | ICD-10-CM | POA: Diagnosis not present

## 2021-04-09 DIAGNOSIS — M6281 Muscle weakness (generalized): Secondary | ICD-10-CM | POA: Diagnosis not present

## 2021-04-10 DIAGNOSIS — M6281 Muscle weakness (generalized): Secondary | ICD-10-CM | POA: Diagnosis not present

## 2021-04-10 DIAGNOSIS — G301 Alzheimer's disease with late onset: Secondary | ICD-10-CM | POA: Diagnosis not present

## 2021-04-10 DIAGNOSIS — N1831 Chronic kidney disease, stage 3a: Secondary | ICD-10-CM | POA: Diagnosis not present

## 2021-04-10 DIAGNOSIS — R262 Difficulty in walking, not elsewhere classified: Secondary | ICD-10-CM | POA: Diagnosis not present

## 2021-04-10 DIAGNOSIS — R269 Unspecified abnormalities of gait and mobility: Secondary | ICD-10-CM | POA: Diagnosis not present

## 2021-04-10 DIAGNOSIS — R634 Abnormal weight loss: Secondary | ICD-10-CM | POA: Diagnosis not present

## 2021-04-13 DIAGNOSIS — N1831 Chronic kidney disease, stage 3a: Secondary | ICD-10-CM | POA: Diagnosis not present

## 2021-04-13 DIAGNOSIS — G301 Alzheimer's disease with late onset: Secondary | ICD-10-CM | POA: Diagnosis not present

## 2021-04-13 DIAGNOSIS — M6281 Muscle weakness (generalized): Secondary | ICD-10-CM | POA: Diagnosis not present

## 2021-04-13 DIAGNOSIS — R634 Abnormal weight loss: Secondary | ICD-10-CM | POA: Diagnosis not present

## 2021-04-13 DIAGNOSIS — R269 Unspecified abnormalities of gait and mobility: Secondary | ICD-10-CM | POA: Diagnosis not present

## 2021-04-13 DIAGNOSIS — R262 Difficulty in walking, not elsewhere classified: Secondary | ICD-10-CM | POA: Diagnosis not present

## 2021-04-14 DIAGNOSIS — M6281 Muscle weakness (generalized): Secondary | ICD-10-CM | POA: Diagnosis not present

## 2021-04-14 DIAGNOSIS — R634 Abnormal weight loss: Secondary | ICD-10-CM | POA: Diagnosis not present

## 2021-04-14 DIAGNOSIS — G301 Alzheimer's disease with late onset: Secondary | ICD-10-CM | POA: Diagnosis not present

## 2021-04-15 DIAGNOSIS — M6281 Muscle weakness (generalized): Secondary | ICD-10-CM | POA: Diagnosis not present

## 2021-04-15 DIAGNOSIS — R634 Abnormal weight loss: Secondary | ICD-10-CM | POA: Diagnosis not present

## 2021-04-15 DIAGNOSIS — G301 Alzheimer's disease with late onset: Secondary | ICD-10-CM | POA: Diagnosis not present

## 2021-04-16 DIAGNOSIS — M6281 Muscle weakness (generalized): Secondary | ICD-10-CM | POA: Diagnosis not present

## 2021-04-16 DIAGNOSIS — G301 Alzheimer's disease with late onset: Secondary | ICD-10-CM | POA: Diagnosis not present

## 2021-04-16 DIAGNOSIS — R634 Abnormal weight loss: Secondary | ICD-10-CM | POA: Diagnosis not present

## 2021-04-17 DIAGNOSIS — G301 Alzheimer's disease with late onset: Secondary | ICD-10-CM | POA: Diagnosis not present

## 2021-04-17 DIAGNOSIS — R634 Abnormal weight loss: Secondary | ICD-10-CM | POA: Diagnosis not present

## 2021-04-17 DIAGNOSIS — N1831 Chronic kidney disease, stage 3a: Secondary | ICD-10-CM | POA: Diagnosis not present

## 2021-04-17 DIAGNOSIS — M6281 Muscle weakness (generalized): Secondary | ICD-10-CM | POA: Diagnosis not present

## 2021-04-18 DIAGNOSIS — R269 Unspecified abnormalities of gait and mobility: Secondary | ICD-10-CM | POA: Diagnosis not present

## 2021-04-18 DIAGNOSIS — N1831 Chronic kidney disease, stage 3a: Secondary | ICD-10-CM | POA: Diagnosis not present

## 2021-04-18 DIAGNOSIS — R262 Difficulty in walking, not elsewhere classified: Secondary | ICD-10-CM | POA: Diagnosis not present

## 2021-04-18 DIAGNOSIS — R634 Abnormal weight loss: Secondary | ICD-10-CM | POA: Diagnosis not present

## 2021-04-18 DIAGNOSIS — M6281 Muscle weakness (generalized): Secondary | ICD-10-CM | POA: Diagnosis not present

## 2021-04-18 DIAGNOSIS — G301 Alzheimer's disease with late onset: Secondary | ICD-10-CM | POA: Diagnosis not present

## 2021-04-20 DIAGNOSIS — G301 Alzheimer's disease with late onset: Secondary | ICD-10-CM | POA: Diagnosis not present

## 2021-04-20 DIAGNOSIS — R634 Abnormal weight loss: Secondary | ICD-10-CM | POA: Diagnosis not present

## 2021-04-20 DIAGNOSIS — M6281 Muscle weakness (generalized): Secondary | ICD-10-CM | POA: Diagnosis not present

## 2021-04-20 DIAGNOSIS — N1831 Chronic kidney disease, stage 3a: Secondary | ICD-10-CM | POA: Diagnosis not present

## 2021-04-21 DIAGNOSIS — R634 Abnormal weight loss: Secondary | ICD-10-CM | POA: Diagnosis not present

## 2021-04-21 DIAGNOSIS — M6281 Muscle weakness (generalized): Secondary | ICD-10-CM | POA: Diagnosis not present

## 2021-04-21 DIAGNOSIS — G301 Alzheimer's disease with late onset: Secondary | ICD-10-CM | POA: Diagnosis not present

## 2021-04-22 DIAGNOSIS — R634 Abnormal weight loss: Secondary | ICD-10-CM | POA: Diagnosis not present

## 2021-04-22 DIAGNOSIS — M6281 Muscle weakness (generalized): Secondary | ICD-10-CM | POA: Diagnosis not present

## 2021-04-22 DIAGNOSIS — R051 Acute cough: Secondary | ICD-10-CM | POA: Diagnosis not present

## 2021-04-22 DIAGNOSIS — I129 Hypertensive chronic kidney disease with stage 1 through stage 4 chronic kidney disease, or unspecified chronic kidney disease: Secondary | ICD-10-CM | POA: Diagnosis not present

## 2021-04-22 DIAGNOSIS — J9811 Atelectasis: Secondary | ICD-10-CM | POA: Diagnosis not present

## 2021-04-22 DIAGNOSIS — G301 Alzheimer's disease with late onset: Secondary | ICD-10-CM | POA: Diagnosis not present

## 2021-04-22 DIAGNOSIS — K219 Gastro-esophageal reflux disease without esophagitis: Secondary | ICD-10-CM | POA: Diagnosis not present

## 2021-04-22 DIAGNOSIS — R059 Cough, unspecified: Secondary | ICD-10-CM | POA: Diagnosis not present

## 2021-04-22 DIAGNOSIS — D509 Iron deficiency anemia, unspecified: Secondary | ICD-10-CM | POA: Diagnosis not present

## 2021-04-22 DIAGNOSIS — R269 Unspecified abnormalities of gait and mobility: Secondary | ICD-10-CM | POA: Diagnosis not present

## 2021-04-23 DIAGNOSIS — R262 Difficulty in walking, not elsewhere classified: Secondary | ICD-10-CM | POA: Diagnosis not present

## 2021-04-23 DIAGNOSIS — M6281 Muscle weakness (generalized): Secondary | ICD-10-CM | POA: Diagnosis not present

## 2021-04-23 DIAGNOSIS — R634 Abnormal weight loss: Secondary | ICD-10-CM | POA: Diagnosis not present

## 2021-04-23 DIAGNOSIS — R269 Unspecified abnormalities of gait and mobility: Secondary | ICD-10-CM | POA: Diagnosis not present

## 2021-04-23 DIAGNOSIS — N1831 Chronic kidney disease, stage 3a: Secondary | ICD-10-CM | POA: Diagnosis not present

## 2021-04-23 DIAGNOSIS — G301 Alzheimer's disease with late onset: Secondary | ICD-10-CM | POA: Diagnosis not present

## 2021-04-24 DIAGNOSIS — G301 Alzheimer's disease with late onset: Secondary | ICD-10-CM | POA: Diagnosis not present

## 2021-04-24 DIAGNOSIS — E785 Hyperlipidemia, unspecified: Secondary | ICD-10-CM | POA: Diagnosis not present

## 2021-04-24 DIAGNOSIS — M6281 Muscle weakness (generalized): Secondary | ICD-10-CM | POA: Diagnosis not present

## 2021-04-24 DIAGNOSIS — N1831 Chronic kidney disease, stage 3a: Secondary | ICD-10-CM | POA: Diagnosis not present

## 2021-04-24 DIAGNOSIS — I10 Essential (primary) hypertension: Secondary | ICD-10-CM | POA: Diagnosis not present

## 2021-04-24 DIAGNOSIS — R634 Abnormal weight loss: Secondary | ICD-10-CM | POA: Diagnosis not present

## 2021-04-27 DIAGNOSIS — N1831 Chronic kidney disease, stage 3a: Secondary | ICD-10-CM | POA: Diagnosis not present

## 2021-04-27 DIAGNOSIS — G301 Alzheimer's disease with late onset: Secondary | ICD-10-CM | POA: Diagnosis not present

## 2021-04-27 DIAGNOSIS — M6281 Muscle weakness (generalized): Secondary | ICD-10-CM | POA: Diagnosis not present

## 2021-04-27 DIAGNOSIS — R634 Abnormal weight loss: Secondary | ICD-10-CM | POA: Diagnosis not present

## 2021-04-29 DIAGNOSIS — G301 Alzheimer's disease with late onset: Secondary | ICD-10-CM | POA: Diagnosis not present

## 2021-04-29 DIAGNOSIS — N1831 Chronic kidney disease, stage 3a: Secondary | ICD-10-CM | POA: Diagnosis not present

## 2021-04-29 DIAGNOSIS — M6281 Muscle weakness (generalized): Secondary | ICD-10-CM | POA: Diagnosis not present

## 2021-04-29 DIAGNOSIS — R634 Abnormal weight loss: Secondary | ICD-10-CM | POA: Diagnosis not present

## 2021-04-29 DIAGNOSIS — J069 Acute upper respiratory infection, unspecified: Secondary | ICD-10-CM | POA: Diagnosis not present

## 2021-04-29 DIAGNOSIS — R051 Acute cough: Secondary | ICD-10-CM | POA: Diagnosis not present

## 2021-04-29 DIAGNOSIS — R269 Unspecified abnormalities of gait and mobility: Secondary | ICD-10-CM | POA: Diagnosis not present

## 2021-05-01 DIAGNOSIS — R634 Abnormal weight loss: Secondary | ICD-10-CM | POA: Diagnosis not present

## 2021-05-01 DIAGNOSIS — G301 Alzheimer's disease with late onset: Secondary | ICD-10-CM | POA: Diagnosis not present

## 2021-05-01 DIAGNOSIS — M6281 Muscle weakness (generalized): Secondary | ICD-10-CM | POA: Diagnosis not present

## 2021-05-01 DIAGNOSIS — N1831 Chronic kidney disease, stage 3a: Secondary | ICD-10-CM | POA: Diagnosis not present

## 2021-05-04 DIAGNOSIS — R269 Unspecified abnormalities of gait and mobility: Secondary | ICD-10-CM | POA: Diagnosis not present

## 2021-05-04 DIAGNOSIS — N1831 Chronic kidney disease, stage 3a: Secondary | ICD-10-CM | POA: Diagnosis not present

## 2021-05-04 DIAGNOSIS — M6281 Muscle weakness (generalized): Secondary | ICD-10-CM | POA: Diagnosis not present

## 2021-05-04 DIAGNOSIS — R634 Abnormal weight loss: Secondary | ICD-10-CM | POA: Diagnosis not present

## 2021-05-04 DIAGNOSIS — G301 Alzheimer's disease with late onset: Secondary | ICD-10-CM | POA: Diagnosis not present

## 2021-05-06 DIAGNOSIS — R269 Unspecified abnormalities of gait and mobility: Secondary | ICD-10-CM | POA: Diagnosis not present

## 2021-05-06 DIAGNOSIS — M6281 Muscle weakness (generalized): Secondary | ICD-10-CM | POA: Diagnosis not present

## 2021-05-06 DIAGNOSIS — R634 Abnormal weight loss: Secondary | ICD-10-CM | POA: Diagnosis not present

## 2021-05-06 DIAGNOSIS — G301 Alzheimer's disease with late onset: Secondary | ICD-10-CM | POA: Diagnosis not present

## 2021-05-06 DIAGNOSIS — N1831 Chronic kidney disease, stage 3a: Secondary | ICD-10-CM | POA: Diagnosis not present

## 2021-05-07 DIAGNOSIS — G301 Alzheimer's disease with late onset: Secondary | ICD-10-CM | POA: Diagnosis not present

## 2021-05-07 DIAGNOSIS — M6281 Muscle weakness (generalized): Secondary | ICD-10-CM | POA: Diagnosis not present

## 2021-05-07 DIAGNOSIS — R269 Unspecified abnormalities of gait and mobility: Secondary | ICD-10-CM | POA: Diagnosis not present

## 2021-05-07 DIAGNOSIS — N1831 Chronic kidney disease, stage 3a: Secondary | ICD-10-CM | POA: Diagnosis not present

## 2021-05-07 DIAGNOSIS — R634 Abnormal weight loss: Secondary | ICD-10-CM | POA: Diagnosis not present

## 2021-05-11 DIAGNOSIS — M6281 Muscle weakness (generalized): Secondary | ICD-10-CM | POA: Diagnosis not present

## 2021-05-11 DIAGNOSIS — N1831 Chronic kidney disease, stage 3a: Secondary | ICD-10-CM | POA: Diagnosis not present

## 2021-05-11 DIAGNOSIS — R634 Abnormal weight loss: Secondary | ICD-10-CM | POA: Diagnosis not present

## 2021-05-11 DIAGNOSIS — G301 Alzheimer's disease with late onset: Secondary | ICD-10-CM | POA: Diagnosis not present

## 2021-05-12 ENCOUNTER — Ambulatory Visit: Payer: Medicare Other | Admitting: Family Medicine

## 2021-05-12 DIAGNOSIS — R269 Unspecified abnormalities of gait and mobility: Secondary | ICD-10-CM | POA: Diagnosis not present

## 2021-05-12 DIAGNOSIS — I129 Hypertensive chronic kidney disease with stage 1 through stage 4 chronic kidney disease, or unspecified chronic kidney disease: Secondary | ICD-10-CM | POA: Diagnosis not present

## 2021-05-12 DIAGNOSIS — D509 Iron deficiency anemia, unspecified: Secondary | ICD-10-CM | POA: Diagnosis not present

## 2021-05-12 DIAGNOSIS — R634 Abnormal weight loss: Secondary | ICD-10-CM | POA: Diagnosis not present

## 2021-05-12 DIAGNOSIS — G301 Alzheimer's disease with late onset: Secondary | ICD-10-CM | POA: Diagnosis not present

## 2021-05-13 DIAGNOSIS — R269 Unspecified abnormalities of gait and mobility: Secondary | ICD-10-CM | POA: Diagnosis not present

## 2021-05-13 DIAGNOSIS — R634 Abnormal weight loss: Secondary | ICD-10-CM | POA: Diagnosis not present

## 2021-05-13 DIAGNOSIS — G301 Alzheimer's disease with late onset: Secondary | ICD-10-CM | POA: Diagnosis not present

## 2021-05-13 DIAGNOSIS — M6281 Muscle weakness (generalized): Secondary | ICD-10-CM | POA: Diagnosis not present

## 2021-05-13 DIAGNOSIS — N1831 Chronic kidney disease, stage 3a: Secondary | ICD-10-CM | POA: Diagnosis not present

## 2021-05-14 DIAGNOSIS — G301 Alzheimer's disease with late onset: Secondary | ICD-10-CM | POA: Diagnosis not present

## 2021-05-14 DIAGNOSIS — M6281 Muscle weakness (generalized): Secondary | ICD-10-CM | POA: Diagnosis not present

## 2021-05-14 DIAGNOSIS — R634 Abnormal weight loss: Secondary | ICD-10-CM | POA: Diagnosis not present

## 2021-05-14 DIAGNOSIS — N1831 Chronic kidney disease, stage 3a: Secondary | ICD-10-CM | POA: Diagnosis not present

## 2021-05-14 DIAGNOSIS — R269 Unspecified abnormalities of gait and mobility: Secondary | ICD-10-CM | POA: Diagnosis not present

## 2021-05-18 ENCOUNTER — Ambulatory Visit: Payer: Medicare Other | Admitting: Family Medicine

## 2021-05-18 NOTE — Progress Notes (Deleted)
      Established patient visit   Patient: Jasmin Roth   DOB: December 30, 1933   88 y.o. Female  MRN: [id number] Visit Date: 05/18/2021  Today's healthcare provider: Lelon Huh, MD   No chief complaint on file.  Subjective    HPI  Hypertension, follow-up  BP Readings from Last 3 Encounters:  01/12/21 (!) 111/54  10/07/20 (!) 184/75  10/03/20 (!) 178/77   Wt Readings from Last 3 Encounters:  10/07/20 99 lb (44.9 kg)  10/03/20 100 lb (45.4 kg)  05/12/20 107 lb (48.5 kg)     She was last seen for hypertension 4 months ago.  BP at that visit was 111/54. Management since that visit includes continue same medication.  She reports {excellent/good/fair/poor:19665} compliance with treatment. She {is/is not:9024} having side effects. {document side effects if present:1} She is following a {diet:21022986} diet. She {is/is not:9024} exercising. She {does/does not:200015} smoke.  Use of agents associated with hypertension: {bp agents assoc with hypertension:511::"none"}.   Outside blood pressures are {***enter patient reported home BP readings, or 'not being checked':1}. Symptoms: {Yes/No:20286} chest pain {Yes/No:20286} chest pressure  {Yes/No:20286} palpitations {Yes/No:20286} syncope  {Yes/No:20286} dyspnea {Yes/No:20286} orthopnea  {Yes/No:20286} paroxysmal nocturnal dyspnea {Yes/No:20286} lower extremity edema   Pertinent labs Lab Results  Component Value Date   CHOL 176 01/30/2019   HDL 66 01/30/2019   LDLCALC 97 01/30/2019   TRIG 71 01/30/2019   CHOLHDL 2.7 01/30/2019   Lab Results  Component Value Date   NA 141 05/12/2020   K 3.5 05/12/2020   CREATININE 0.91 05/12/2020   EGFR 62 05/12/2020   GLUCOSE 98 05/12/2020   TSH 1.620 05/12/2020     The ASCVD Risk score (Arnett DK, et al., 2019) failed to calculate for the following reasons:   The 2019 ASCVD risk score is only valid for ages 76 to  23  ---------------------------------------------------------------------------------------------------   Medications: Outpatient Medications Prior to Visit  Medication Sig   amLODipine (NORVASC) 5 MG tablet TAKE 1 TABLET BY MOUTH DAILY   ASPIRIN LOW DOSE 81 MG EC tablet TAKE 1 TABLET BY MOUTH ONCE A DAY   FEROSUL 325 (65 Fe) MG tablet TAKE 1 TABLET BY MOUTH DAILY   ferrous sulfate 325 (65 FE) MG EC tablet Take 325 mg by mouth daily.    hydrochlorothiazide (HYDRODIURIL) 25 MG tablet TAKE 1 TABLET BY MOUTH DAILY   metoprolol tartrate (LOPRESSOR) 25 MG tablet TAKE 1/2 TABLET BY MOUTH DAILY   mupirocin cream (BACTROBAN) 2 % Apply 1 application topically 2 (two) times daily.   potassium chloride SA (KLOR-CON M) 20 MEQ tablet TAKE 1 TABLET BY MOUTH ONCE A DAY   valsartan (DIOVAN) 40 MG tablet TAKE 1 TABLET BY MOUTH ONCE DAILY   VITAMIN D PO Take by mouth.   No facility-administered medications prior to visit.    Review of Systems  {Labs  Heme  Chem  Endocrine  Serology  Results Review (optional):23779}   Objective    There were no vitals taken for this visit. {Show previous vital signs (optional):23777}  Physical Exam  ***  No results found for any visits on 05/18/21.  Assessment & Plan     ***  No follow-ups on file.      {provider attestation***:1}   Lelon Huh, MD  [facility] [phone] (phone) [phone] (fax)  [place]

## 2021-05-20 ENCOUNTER — Ambulatory Visit: Payer: Self-pay

## 2021-05-20 NOTE — Telephone Encounter (Signed)
Spoke with Olegario Messier, Agent about triage and pt doesn't need triage at this time. Will remove this from triage que.  ?

## 2021-05-20 NOTE — Telephone Encounter (Signed)
I called and spoke with pt at [facility] and she is unable to remember daughter number and didn't think she had called in.  ?Pt's daughter called and received message "call can't be completed at this time..." ? ?Purplish foot like she has never seen, pain in left arm, left shoulder.  Pt is at [facility] [phone]  ?** The message may be incomplete. It was sent as a result of a timeout. **  ?

## 2021-05-21 ENCOUNTER — Telehealth: Payer: Self-pay

## 2021-05-21 NOTE — Telephone Encounter (Signed)
Copied from CRM [id number]. Topic: General - Other >> May 21, 2021  9:25 AM Aretta Nip wrote: Reason for CRM: Notification to Dr Fisher:Pt's [facility] facility called to cancel all appt. Pt will be seeing provider Smiley Houseman  with [facility] at the [facility] where she will be a long term resident/

## 2021-05-22 DIAGNOSIS — E785 Hyperlipidemia, unspecified: Secondary | ICD-10-CM | POA: Diagnosis not present

## 2021-05-22 DIAGNOSIS — E441 Mild protein-calorie malnutrition: Secondary | ICD-10-CM | POA: Diagnosis not present

## 2021-05-25 ENCOUNTER — Ambulatory Visit: Payer: Medicare Other | Admitting: Family Medicine

## 2021-05-25 DIAGNOSIS — G301 Alzheimer's disease with late onset: Secondary | ICD-10-CM | POA: Diagnosis not present

## 2021-06-09 DIAGNOSIS — D509 Iron deficiency anemia, unspecified: Secondary | ICD-10-CM | POA: Diagnosis not present

## 2021-06-09 DIAGNOSIS — N1831 Chronic kidney disease, stage 3a: Secondary | ICD-10-CM | POA: Diagnosis not present

## 2021-06-09 DIAGNOSIS — E441 Mild protein-calorie malnutrition: Secondary | ICD-10-CM | POA: Diagnosis not present

## 2021-06-09 DIAGNOSIS — R269 Unspecified abnormalities of gait and mobility: Secondary | ICD-10-CM | POA: Diagnosis not present

## 2021-06-09 DIAGNOSIS — G301 Alzheimer's disease with late onset: Secondary | ICD-10-CM | POA: Diagnosis not present

## 2021-06-09 DIAGNOSIS — I129 Hypertensive chronic kidney disease with stage 1 through stage 4 chronic kidney disease, or unspecified chronic kidney disease: Secondary | ICD-10-CM | POA: Diagnosis not present

## 2021-06-22 DIAGNOSIS — G301 Alzheimer's disease with late onset: Secondary | ICD-10-CM | POA: Diagnosis not present

## 2021-06-23 DIAGNOSIS — Z993 Dependence on wheelchair: Secondary | ICD-10-CM | POA: Diagnosis not present

## 2021-06-23 DIAGNOSIS — G301 Alzheimer's disease with late onset: Secondary | ICD-10-CM | POA: Diagnosis not present

## 2021-06-23 DIAGNOSIS — I129 Hypertensive chronic kidney disease with stage 1 through stage 4 chronic kidney disease, or unspecified chronic kidney disease: Secondary | ICD-10-CM | POA: Diagnosis not present

## 2021-06-23 DIAGNOSIS — N1831 Chronic kidney disease, stage 3a: Secondary | ICD-10-CM | POA: Diagnosis not present

## 2021-06-25 ENCOUNTER — Encounter: Payer: Self-pay | Admitting: Cardiology

## 2021-06-25 ENCOUNTER — Ambulatory Visit (INDEPENDENT_AMBULATORY_CARE_PROVIDER_SITE_OTHER): Payer: Medicare Other | Admitting: Cardiology

## 2021-06-25 VITALS — BP 120/60 | HR 77 | Ht 60.0 in | Wt 102.0 lb

## 2021-06-25 DIAGNOSIS — I10 Essential (primary) hypertension: Secondary | ICD-10-CM

## 2021-06-25 NOTE — Progress Notes (Signed)
Ekg

## 2021-06-25 NOTE — Progress Notes (Signed)
Cardiology Office Note:    Date:  06/25/2021   ID:  Jasmin Roth, DOB 1933-10-20, MRN [id number]  PCP:  Malva Limes, MD   [facility] Providers Cardiologist:  None     Referring MD: Malva Limes, MD   Chief Complaint  Patient presents with   NEW patient-Referred by Dr. Mila Merry     History of Present Illness:    Jasmin Roth is a 88 y.o. female with a hx of hypertension, dementia who presents to establish care.  History is limited by condition of patient.  She is without any caregiver during today's visit.  She is sitting in chair, denies any chest pain or shortness of breath.  Does not know why she is here.  Denies edema.  States living "in town" when asked where she lives.  Reports indicate she lives in an assisted living facility.  Past Medical History:  Diagnosis Date   Hypertension     Past Surgical History:  Procedure Laterality Date   CESAREAN SECTION     ELBOW LIGAMENT RECONSTRUCTION     TONSILLECTOMY AND ADENOIDECTOMY      Current Medications: Current Meds  Medication Sig   ADVAIR DISKUS 100-50 MCG/ACT AEPB Inhale 1 puff into the lungs in the morning and at bedtime.   amLODipine (NORVASC) 5 MG tablet TAKE 1 TABLET BY MOUTH DAILY   ASPIRIN LOW DOSE 81 MG EC tablet TAKE 1 TABLET BY MOUTH ONCE A DAY   ferrous sulfate 325 (65 FE) MG EC tablet Take 325 mg by mouth daily.    hydrochlorothiazide (HYDRODIURIL) 25 MG tablet TAKE 1 TABLET BY MOUTH DAILY   metoprolol tartrate (LOPRESSOR) 25 MG tablet TAKE 1/2 TABLET BY MOUTH DAILY   mupirocin cream (BACTROBAN) 2 % Apply 1 application topically 2 (two) times daily.   potassium chloride SA (KLOR-CON M) 20 MEQ tablet TAKE 1 TABLET BY MOUTH ONCE A DAY   valsartan (DIOVAN) 40 MG tablet TAKE 1 TABLET BY MOUTH ONCE DAILY   vitamin C (ASCORBIC ACID) 500 MG tablet Take 500 mg by mouth daily.     Allergies:   Patient has no known allergies.   Social History   Socioeconomic History   Marital status:  Widowed    Spouse name: Not on file   Number of children: Not on file   Years of education: Not on file   Highest education level: Not on file  Occupational History   Not on file  Tobacco Use   Smoking status: Never   Smokeless tobacco: Never  Vaping Use   Vaping Use: Never used  Substance and Sexual Activity   Alcohol use: No   Drug use: No   Sexual activity: Not on file  Other Topics Concern   Not on file  Social History Narrative   Lives with her daughter who cares for her full time.   Social Determinants of Health   Financial Resource Strain: Not on file  Food Insecurity: Not on file  Transportation Needs: Not on file  Physical Activity: Not on file  Stress: Not on file  Social Connections: Not on file     Family History: The patient's family history includes Heart failure in her mother; Hypertension in her mother; Kidney failure in her father.  ROS:   Please see the history of present illness.     All other systems reviewed and are negative.  EKGs/Labs/Other Studies Reviewed:    The following studies were reviewed today:   EKG:  EKG is  ordered today.  The ekg ordered today demonstrates sinus rhythm, first-degree AV block  Recent Labs: No results found for requested labs within last 365 days.  Recent Lipid Panel    Component Value Date/Time   CHOL 176 01/30/2019 1114   CHOL 133 08/30/2011 0403   TRIG 71 01/30/2019 1114   TRIG 78 08/30/2011 0403   HDL 66 01/30/2019 1114   HDL 47 08/30/2011 0403   CHOLHDL 2.7 01/30/2019 1114   VLDL 16 08/30/2011 0403   LDLCALC 97 01/30/2019 1114   LDLCALC 70 08/30/2011 0403     Risk Assessment/Calculations:          Physical Exam:    VS:  BP 120/60 (BP Location: Left Arm, Patient Position: Sitting, Cuff Size: Normal)   Pulse 77   Ht 5' (1.524 m)   Wt 102 lb (46.3 kg)   SpO2 95%   BMI 19.92 kg/m     Wt Readings from Last 3 Encounters:  06/25/21 102 lb (46.3 kg)  10/07/20 99 lb (44.9 kg)  10/03/20 100  lb (45.4 kg)     GEN:  Well nourished, well developed in no acute distress HEENT: Normal NECK: No JVD; No carotid bruits CARDIAC: RRR, no murmurs, rubs, gallops RESPIRATORY:  Clear to auscultation without rales, wheezing or rhonchi  ABDOMEN: Soft, non-tender, non-distended MUSCULOSKELETAL:  No edema; No deformity  SKIN: Warm and dry NEUROLOGIC:  Alert and oriented to person only PSYCHIATRIC:  Normal affect   ASSESSMENT:    1. Primary hypertension    PLAN:    In order of problems listed above:  Hypertension, blood pressure control.  Continue current BP meds.  Patient has a history of dementia, history extremely limited by condition of patient.  She denies chest pain or shortness of breath, denies edema.  Physical exam is benign.  There is no available staff from patient's facility to assist with history taking.  Recommend patient to follow-up as needed with facility staff present to help with history.  No indication for cardiac testing as patient denies any symptoms, vitals appear normal.  Follow-up as needed      Medication Adjustments/Labs and Tests Ordered: Current medicines are reviewed at length with the patient today.  Concerns regarding medicines are outlined above.  Orders Placed This Encounter  Procedures   EKG 12-Lead   No orders of the defined types were placed in this encounter.   Patient Instructions  PATIENT NEEDS TO HAVE SOMEONE WITH HER FOR ANY FURTHER VISITS. WAS UNABLE TO GET ANY INFORMATION FROM PATIENT.  Medication Instructions:  Your physician recommends that you continue on your current medications as directed. Please refer to the Current Medication list given to you today.  *If you need a refill on your cardiac medications before your next appointment, please call your pharmacy*    Follow-Up: At [facility], you and your health needs are our priority.  As part of our continuing mission to provide you with exceptional heart care, we have created  designated Provider Care Teams.  These Care Teams include your primary Cardiologist (physician) and Advanced Practice Providers (APPs -  Physician Assistants and Nurse Practitioners) who all work together to provide you with the care you need, when you need it.  We recommend signing up for the patient portal called "MyChart".  Sign up information is provided on this After Visit Summary.  MyChart is used to connect with patients for Virtual Visits (Telemedicine).  Patients are able to view lab/test results, encounter  notes, upcoming appointments, etc.  Non-urgent messages can be sent to your provider as well.   To learn more about what you can do with MyChart, go to [email or web address].    Your next appointment:   Follow up as needed   The format for your next appointment:   In Person  Provider:   Debbe Odea, MD    Other Instructions   Important Information About Sugar         Signed, Debbe Odea, MD  06/25/2021 10:45 AM    [facility]

## 2021-06-25 NOTE — Patient Instructions (Signed)
PATIENT NEEDS TO HAVE SOMEONE WITH HER FOR ANY FURTHER VISITS. WAS UNABLE TO GET ANY INFORMATION FROM PATIENT.  Medication Instructions:  Your physician recommends that you continue on your current medications as directed. Please refer to the Current Medication list given to you today.  *If you need a refill on your cardiac medications before your next appointment, please call your pharmacy*    Follow-Up: At [facility], you and your health needs are our priority.  As part of our continuing mission to provide you with exceptional heart care, we have created designated Provider Care Teams.  These Care Teams include your primary Cardiologist (physician) and Advanced Practice Providers (APPs -  Physician Assistants and Nurse Practitioners) who all work together to provide you with the care you need, when you need it.  We recommend signing up for the patient portal called "MyChart".  Sign up information is provided on this After Visit Summary.  MyChart is used to connect with patients for Virtual Visits (Telemedicine).  Patients are able to view lab/test results, encounter notes, upcoming appointments, etc.  Non-urgent messages can be sent to your provider as well.   To learn more about what you can do with MyChart, go to [email or web address].    Your next appointment:   Follow up as needed   The format for your next appointment:   In Person  Provider:   Debbe Odea, MD    Other Instructions   Important Information About Sugar

## 2021-07-01 DIAGNOSIS — E785 Hyperlipidemia, unspecified: Secondary | ICD-10-CM | POA: Diagnosis not present

## 2021-07-01 DIAGNOSIS — E441 Mild protein-calorie malnutrition: Secondary | ICD-10-CM | POA: Diagnosis not present

## 2021-07-02 DIAGNOSIS — Z79899 Other long term (current) drug therapy: Secondary | ICD-10-CM | POA: Diagnosis not present

## 2021-07-06 DIAGNOSIS — E876 Hypokalemia: Secondary | ICD-10-CM | POA: Diagnosis not present

## 2021-07-06 DIAGNOSIS — M40209 Unspecified kyphosis, site unspecified: Secondary | ICD-10-CM | POA: Diagnosis not present

## 2021-07-06 DIAGNOSIS — G301 Alzheimer's disease with late onset: Secondary | ICD-10-CM | POA: Diagnosis not present

## 2021-07-06 DIAGNOSIS — E441 Mild protein-calorie malnutrition: Secondary | ICD-10-CM | POA: Diagnosis not present

## 2021-07-06 DIAGNOSIS — D509 Iron deficiency anemia, unspecified: Secondary | ICD-10-CM | POA: Diagnosis not present

## 2021-07-06 DIAGNOSIS — Z6820 Body mass index (BMI) 20.0-20.9, adult: Secondary | ICD-10-CM | POA: Diagnosis not present

## 2021-07-06 DIAGNOSIS — N1831 Chronic kidney disease, stage 3a: Secondary | ICD-10-CM | POA: Diagnosis not present

## 2021-07-06 DIAGNOSIS — R269 Unspecified abnormalities of gait and mobility: Secondary | ICD-10-CM | POA: Diagnosis not present

## 2021-07-06 DIAGNOSIS — I129 Hypertensive chronic kidney disease with stage 1 through stage 4 chronic kidney disease, or unspecified chronic kidney disease: Secondary | ICD-10-CM | POA: Diagnosis not present

## 2021-07-06 DIAGNOSIS — F02B0 Dementia in other diseases classified elsewhere, moderate, without behavioral disturbance, psychotic disturbance, mood disturbance, and anxiety: Secondary | ICD-10-CM | POA: Diagnosis not present

## 2021-08-03 DIAGNOSIS — E785 Hyperlipidemia, unspecified: Secondary | ICD-10-CM | POA: Diagnosis not present

## 2021-08-03 DIAGNOSIS — F02B0 Dementia in other diseases classified elsewhere, moderate, without behavioral disturbance, psychotic disturbance, mood disturbance, and anxiety: Secondary | ICD-10-CM | POA: Diagnosis not present

## 2021-08-03 DIAGNOSIS — E441 Mild protein-calorie malnutrition: Secondary | ICD-10-CM | POA: Diagnosis not present

## 2021-08-03 DIAGNOSIS — G301 Alzheimer's disease with late onset: Secondary | ICD-10-CM | POA: Diagnosis not present

## 2021-08-04 DIAGNOSIS — F02B0 Dementia in other diseases classified elsewhere, moderate, without behavioral disturbance, psychotic disturbance, mood disturbance, and anxiety: Secondary | ICD-10-CM | POA: Diagnosis not present

## 2021-08-04 DIAGNOSIS — G301 Alzheimer's disease with late onset: Secondary | ICD-10-CM | POA: Diagnosis not present

## 2021-08-04 DIAGNOSIS — N1831 Chronic kidney disease, stage 3a: Secondary | ICD-10-CM | POA: Diagnosis not present

## 2021-08-04 DIAGNOSIS — I129 Hypertensive chronic kidney disease with stage 1 through stage 4 chronic kidney disease, or unspecified chronic kidney disease: Secondary | ICD-10-CM | POA: Diagnosis not present

## 2021-08-04 DIAGNOSIS — I739 Peripheral vascular disease, unspecified: Secondary | ICD-10-CM | POA: Diagnosis not present

## 2021-08-04 DIAGNOSIS — R269 Unspecified abnormalities of gait and mobility: Secondary | ICD-10-CM | POA: Diagnosis not present

## 2021-09-02 ENCOUNTER — Emergency Department: Payer: Medicare Other

## 2021-09-02 ENCOUNTER — Emergency Department
Admission: EM | Admit: 2021-09-02 | Discharge: 2021-09-02 | Disposition: A | Discharge status: Skilled Nursing Facility | Payer: Medicare Other | Attending: Emergency Medicine | Admitting: Emergency Medicine

## 2021-09-02 ENCOUNTER — Other Ambulatory Visit: Payer: Self-pay

## 2021-09-02 ENCOUNTER — Encounter: Payer: Self-pay | Admitting: Emergency Medicine

## 2021-09-02 DIAGNOSIS — I10 Essential (primary) hypertension: Secondary | ICD-10-CM | POA: Diagnosis not present

## 2021-09-02 DIAGNOSIS — Z7401 Bed confinement status: Secondary | ICD-10-CM | POA: Diagnosis not present

## 2021-09-02 DIAGNOSIS — F0390 Unspecified dementia without behavioral disturbance: Secondary | ICD-10-CM | POA: Insufficient documentation

## 2021-09-02 DIAGNOSIS — R609 Edema, unspecified: Secondary | ICD-10-CM | POA: Diagnosis not present

## 2021-09-02 DIAGNOSIS — R600 Localized edema: Secondary | ICD-10-CM | POA: Insufficient documentation

## 2021-09-02 DIAGNOSIS — Z743 Need for continuous supervision: Secondary | ICD-10-CM | POA: Diagnosis not present

## 2021-09-02 DIAGNOSIS — L03116 Cellulitis of left lower limb: Secondary | ICD-10-CM | POA: Insufficient documentation

## 2021-09-02 DIAGNOSIS — M7989 Other specified soft tissue disorders: Secondary | ICD-10-CM | POA: Diagnosis not present

## 2021-09-02 DIAGNOSIS — R6889 Other general symptoms and signs: Secondary | ICD-10-CM | POA: Diagnosis not present

## 2021-09-02 DIAGNOSIS — R2242 Localized swelling, mass and lump, left lower limb: Secondary | ICD-10-CM | POA: Diagnosis present

## 2021-09-02 LAB — CBC WITH DIFFERENTIAL/PLATELET
Abs Immature Granulocytes: 0.33 10*3/uL — ABNORMAL HIGH (ref 0.00–0.07)
Basophils Absolute: 0 10*3/uL (ref 0.0–0.1)
Basophils Relative: 1 %
Eosinophils Absolute: 0.1 10*3/uL (ref 0.0–0.5)
Eosinophils Relative: 1 %
HCT: 36.8 % (ref 36.0–46.0)
Hemoglobin: 11.8 g/dL — ABNORMAL LOW (ref 12.0–15.0)
Immature Granulocytes: 4 %
Lymphocytes Relative: 12 %
Lymphs Abs: 1 10*3/uL (ref 0.7–4.0)
MCH: 30.5 pg (ref 26.0–34.0)
MCHC: 32.1 g/dL (ref 30.0–36.0)
MCV: 95.1 fL (ref 80.0–100.0)
Monocytes Absolute: 2.9 10*3/uL — ABNORMAL HIGH (ref 0.1–1.0)
Monocytes Relative: 34 %
Neutro Abs: 4.1 10*3/uL (ref 1.7–7.7)
Neutrophils Relative %: 48 %
Platelets: 122 10*3/uL — ABNORMAL LOW (ref 150–400)
RBC: 3.87 MIL/uL (ref 3.87–5.11)
RDW: 19.5 % — ABNORMAL HIGH (ref 11.5–15.5)
Smear Review: NORMAL
WBC: 8.4 10*3/uL (ref 4.0–10.5)
nRBC: 0 % (ref 0.0–0.2)

## 2021-09-02 LAB — COMPREHENSIVE METABOLIC PANEL
ALT: 11 U/L (ref 0–44)
AST: 19 U/L (ref 15–41)
Albumin: 4.4 g/dL (ref 3.5–5.0)
Alkaline Phosphatase: 63 U/L (ref 38–126)
Anion gap: 8 (ref 5–15)
BUN: 25 mg/dL — ABNORMAL HIGH (ref 8–23)
CO2: 23 mmol/L (ref 22–32)
Calcium: 10 mg/dL (ref 8.9–10.3)
Chloride: 107 mmol/L (ref 98–111)
Creatinine, Ser: 1.14 mg/dL — ABNORMAL HIGH (ref 0.44–1.00)
GFR, Estimated: 47 mL/min — ABNORMAL LOW (ref >60)
Glucose, Bld: 105 mg/dL — ABNORMAL HIGH (ref 70–99)
Potassium: 5.1 mmol/L (ref 3.5–5.1)
Sodium: 138 mmol/L (ref 135–145)
Total Bilirubin: 1 mg/dL (ref 0.3–1.2)
Total Protein: 7.8 g/dL (ref 6.5–8.1)

## 2021-09-02 LAB — BRAIN NATRIURETIC PEPTIDE: B Natriuretic Peptide: 192.4 pg/mL — ABNORMAL HIGH (ref 0.0–100.0)

## 2021-09-02 MED ORDER — CEPHALEXIN 500 MG PO CAPS
500.0000 mg | ORAL_CAPSULE | Freq: Once | ORAL | Status: AC
  Administered 2021-09-02: 500 mg via ORAL
  Filled 2021-09-02: qty 1

## 2021-09-02 MED ORDER — CEPHALEXIN 500 MG PO CAPS: 500.0000 mg | ORAL_CAPSULE | Freq: Four times a day (QID) | ORAL | 0 refills | Status: AC

## 2021-09-02 NOTE — ED Notes (Signed)
First Nurse Note: Pt to ED via [facility] form [facility] for left foot pain and swelling. Per EMS pts foot is purplish/blue, EMS reports they were unable to palpate a pedal pulse.   BP: 130/60 HR 88 SpO2: 98%

## 2021-09-02 NOTE — ED Provider Triage Note (Signed)
  Emergency Medicine Provider Triage Evaluation Note  Jasmin Roth , a 88 y.o.female,  was evaluated in triage.  Pt complains of left foot swelling/discoloration.  She was brought via EMS.  When asked, patient states that she is not quite sure why she is here.   Review of Systems  Positive: Left foot discomfort/discolaration Negative: Denies fever, chest pain, vomiting  Physical Exam  There were no vitals filed for this visit. Gen:   Awake, no distress   Resp:  Normal effort  MSK:   Moves extremities without difficulty  Other:  Left foot notably swollen, purple/blue discoloration.  Medical Decision Making  Given the patient's initial medical screening exam, the following diagnostic evaluation has been ordered. The patient will be placed in the appropriate treatment space, once one is available, to complete the evaluation and treatment. I have discussed the plan of care with the patient and I have advised the patient that an ED physician or mid-level practitioner will reevaluate their condition after the test results have been received, as the results may give them additional insight into the type of treatment they may need.    Diagnostics: dvt ultrasound, foot x ray, labs  Treatments: none immediately   Varney Daily, [place] 09/02/21 1448

## 2021-09-02 NOTE — ED Provider Notes (Signed)
[facility] Provider Note    Event Date/Time   First MD Initiated Contact with Patient 09/02/21 1759     (approximate)   History   Leg Swelling   HPI  Jasmin Roth is a 88 y.o. female with a history of hypertension and dementia.  Reviewed note from cardiology from June 22.  EM caveat, limitation due to dementia  EMS received report from [facility] the patient was being evaluated for left foot pain and swelling.  It is not known if any trauma has occurred though none is discretely known.  She does have a history of dementia.  EMS was concerned that the left foot felt slightly cool to touch  Patient herself has no acute complaint.  She denies pain in the left foot but tells me when she looks at it it looks a little more red or slightly swollen compared to the right.  Of note she also advises that she still lives with her mother and father, who clearly has some disorientation and is not a reliable historian  She does not recall any fall or injury.  She reports the foot does not feel cold.  She reports it just seems a little swollen and slightly red for the left foot.  Denies issue with the right foot.  No other concerns     Physical Exam   Triage Vital Signs: ED Triage Vitals  Enc Vitals Group     BP 09/02/21 1421 130/70     Pulse Rate 09/02/21 1421 74     Resp 09/02/21 1421 18     Temp 09/02/21 1421 (!) 97 F (36.1 C)     Temp Source 09/02/21 1421 Oral     SpO2 09/02/21 1421 97 %     Weight --      Height --      Head Circumference --      Peak Flow --      Pain Score 09/02/21 1457 3     Pain Loc --      Pain Edu? --      Excl. in GC? --     Most recent vital signs: Vitals:   09/02/21 2029 09/02/21 2100  BP: (!) 160/71 139/68  Pulse: 83 79  Resp: 15 15  Temp:    SpO2: 99% 100%     General: Awake, no distress.  Pleasant, oriented to self but not to year or living situation.  CV:  Good peripheral perfusion.  She also has  strong well-perfused dorsalis pedis and posterior tibial pulses notable in both lower extremities.  Additionally I was able to Doppler without difficulty a left popliteal artery pulsation as well. Resp:  Normal effort.  Clear, speaks without difficulty or distress Abd:  No distention.  Other:    Right lower extremity moves well, atraumatic.  No pain or discomfort.  Left lower extremity moves well atraumatic without pain or discomfort, but her left foot does appear mildly edematous with mild erythema about the toes and midfoot.  There is no discrete abscess or obvious open wound or lesion.  There is no purulent drainage.  She reports area to be just slightly tender.  No obvious deformity.  No pain over the ankle or remainder of the left lower extremity no pain over the left hip femur knee or tib-fib.  Left foot slightly warm to touch.  Well-perfused.  No cyanosis or pallor.  No evidence of ischemia   ED Results / Procedures / Treatments  Labs (all labs ordered are listed, but only abnormal results are displayed) Labs Reviewed  COMPREHENSIVE METABOLIC PANEL - Abnormal; Notable for the following components:      Result Value   Glucose, Bld 105 (*)    BUN 25 (*)    Creatinine, Ser 1.14 (*)    GFR, Estimated 47 (*)    All other components within normal limits  CBC WITH DIFFERENTIAL/PLATELET - Abnormal; Notable for the following components:   Hemoglobin 11.8 (*)    RDW 19.5 (*)    Platelets 122 (*)    Monocytes Absolute 2.9 (*)    Abs Immature Granulocytes 0.33 (*)    All other components within normal limits  BRAIN NATRIURETIC PEPTIDE - Abnormal; Notable for the following components:   B Natriuretic Peptide 192.4 (*)    All other components within normal limits     EKG     RADIOLOGY   US Venous Img Lower  Left (DVT Study)  Result Date: 09/02/2021 CLINICAL DATA:  Left foot swelling. EXAM: LEFT LOWER EXTREMITY VENOUS DOPPLER ULTRASOUND TECHNIQUE: Gray-scale sonography with  compression, as well as color and duplex ultrasound, were performed to evaluate the deep venous system(s) from the level of the common femoral vein through the popliteal and proximal calf veins. COMPARISON:  None Available. FINDINGS: VENOUS Normal compressibility of the common femoral, superficial femoral, and popliteal veins. The calf veins are not visualized. Visualized portions of profunda femoral vein and great saphenous vein unremarkable. No filling defects to suggest DVT on grayscale or color Doppler imaging. Doppler waveforms show normal direction of venous flow, normal respiratory plasticity and response to augmentation. Limited views of the contralateral common femoral vein are unremarkable. OTHER None. Limitations: none IMPRESSION: 1. No deep venous thrombosis identified in the left lower extremity. 2. Note is made that the calf veins are not visualized. Electronically Signed   By: Darliss Cheney M.D.   On: 09/02/2021 15:13     Left foot x-ray interpreted by me as negative for acute fracture or obvious erosive type pathology.   PROCEDURES:  Critical Care performed: No  Procedures   MEDICATIONS ORDERED IN ED: Medications  cephALEXin (KEFLEX) capsule 500 mg (has no administration in time range)     IMPRESSION / MDM / ASSESSMENT AND PLAN / ED COURSE  I reviewed the triage vital signs and the nursing notes.                              Differential diagnosis includes, but is not limited to, possible mild cellulitis, pressure injury, inflammatory response to the left foot, osteomyelitis, ischemia, trauma or injury.  Upon evaluation no evidence to support ischemia.  There is mild erythema and slight edema of the foot, and given this finding is unclear if this could be somewhat related to an inflammatory response or possibly infectious but given the nature and mild erythema we will elect to place patient on cephalexin.  Recommend follow-up with primary care within the next 48 hours for  recheck.  Patient in no acute distress, reassuring exam reassuring vital signs.  Patient had a BNP checked, not certain as to reason for this test being ordered.  Patient denies any cardiorespiratory symptoms and shows no evidence of volume overload  Patient's presentation is most consistent with acute complicated illness / injury requiring diagnostic workup.  ----------------------------------------- 10:28 PM on 09/02/2021 ----------------------------------------- I attempted to call Sylvie Farrier, listed emergency contact.  Unable to leave  voicemail, phone rings but advises voicemail box is full.  Treatment recommendations, antibiotic prescription, and follow-up recommendations provided to patient and for her care facility to follow-up.  FINAL CLINICAL IMPRESSION(S) / ED DIAGNOSES   Final diagnoses:  Cellulitis and abscess of toe of left foot     Rx / DC Orders   ED Discharge Orders     None        Note:  This document was prepared using Dragon voice recognition software and may include unintentional dictation errors.   Sharyn Creamer, MD 09/02/21 2306

## 2021-09-02 NOTE — ED Notes (Signed)
Pt has moderate swelling in her left foot. Pt foot is cool to touch, cap refill mildly delayed. Pt is able to move her toes. Some discoloration noted.

## 2021-09-02 NOTE — ED Triage Notes (Signed)
Pt to ED via [facility] from [facility]. Pt here for left foot swelling and pain. Per EMS pt foot is cool to touch. Pt has hx/o mild dementia. Pt is currently in NAD.

## 2021-09-08 DIAGNOSIS — R269 Unspecified abnormalities of gait and mobility: Secondary | ICD-10-CM | POA: Diagnosis not present

## 2021-09-08 DIAGNOSIS — R600 Localized edema: Secondary | ICD-10-CM | POA: Diagnosis not present

## 2021-09-08 DIAGNOSIS — I739 Peripheral vascular disease, unspecified: Secondary | ICD-10-CM | POA: Diagnosis not present

## 2021-09-08 DIAGNOSIS — I129 Hypertensive chronic kidney disease with stage 1 through stage 4 chronic kidney disease, or unspecified chronic kidney disease: Secondary | ICD-10-CM | POA: Diagnosis not present

## 2021-09-08 DIAGNOSIS — N1831 Chronic kidney disease, stage 3a: Secondary | ICD-10-CM | POA: Diagnosis not present

## 2021-09-08 DIAGNOSIS — G301 Alzheimer's disease with late onset: Secondary | ICD-10-CM | POA: Diagnosis not present

## 2021-09-08 DIAGNOSIS — L03116 Cellulitis of left lower limb: Secondary | ICD-10-CM | POA: Diagnosis not present

## 2021-09-08 DIAGNOSIS — F02B0 Dementia in other diseases classified elsewhere, moderate, without behavioral disturbance, psychotic disturbance, mood disturbance, and anxiety: Secondary | ICD-10-CM | POA: Diagnosis not present

## 2021-09-22 ENCOUNTER — Other Ambulatory Visit: Payer: Self-pay

## 2021-09-22 ENCOUNTER — Ambulatory Visit: Payer: Medicare Other | Admitting: Gastroenterology

## 2021-09-25 DIAGNOSIS — E785 Hyperlipidemia, unspecified: Secondary | ICD-10-CM | POA: Diagnosis not present

## 2021-09-25 DIAGNOSIS — E441 Mild protein-calorie malnutrition: Secondary | ICD-10-CM | POA: Diagnosis not present

## 2021-09-28 DIAGNOSIS — G301 Alzheimer's disease with late onset: Secondary | ICD-10-CM | POA: Diagnosis not present

## 2021-09-28 DIAGNOSIS — F02B0 Dementia in other diseases classified elsewhere, moderate, without behavioral disturbance, psychotic disturbance, mood disturbance, and anxiety: Secondary | ICD-10-CM | POA: Diagnosis not present

## 2021-10-06 DIAGNOSIS — R269 Unspecified abnormalities of gait and mobility: Secondary | ICD-10-CM | POA: Diagnosis not present

## 2021-10-06 DIAGNOSIS — G301 Alzheimer's disease with late onset: Secondary | ICD-10-CM | POA: Diagnosis not present

## 2021-10-06 DIAGNOSIS — N1831 Chronic kidney disease, stage 3a: Secondary | ICD-10-CM | POA: Diagnosis not present

## 2021-10-06 DIAGNOSIS — F02C0 Dementia in other diseases classified elsewhere, severe, without behavioral disturbance, psychotic disturbance, mood disturbance, and anxiety: Secondary | ICD-10-CM | POA: Diagnosis not present

## 2021-10-06 DIAGNOSIS — I129 Hypertensive chronic kidney disease with stage 1 through stage 4 chronic kidney disease, or unspecified chronic kidney disease: Secondary | ICD-10-CM | POA: Diagnosis not present

## 2021-10-15 DIAGNOSIS — Z79899 Other long term (current) drug therapy: Secondary | ICD-10-CM | POA: Diagnosis not present

## 2021-11-02 DIAGNOSIS — E441 Mild protein-calorie malnutrition: Secondary | ICD-10-CM | POA: Diagnosis not present

## 2021-11-02 DIAGNOSIS — E785 Hyperlipidemia, unspecified: Secondary | ICD-10-CM | POA: Diagnosis not present

## 2021-11-02 DIAGNOSIS — F02C0 Dementia in other diseases classified elsewhere, severe, without behavioral disturbance, psychotic disturbance, mood disturbance, and anxiety: Secondary | ICD-10-CM | POA: Diagnosis not present

## 2021-11-02 DIAGNOSIS — G301 Alzheimer's disease with late onset: Secondary | ICD-10-CM | POA: Diagnosis not present

## 2021-11-30 DIAGNOSIS — F02C0 Dementia in other diseases classified elsewhere, severe, without behavioral disturbance, psychotic disturbance, mood disturbance, and anxiety: Secondary | ICD-10-CM | POA: Diagnosis not present

## 2021-11-30 DIAGNOSIS — E441 Mild protein-calorie malnutrition: Secondary | ICD-10-CM | POA: Diagnosis not present

## 2021-11-30 DIAGNOSIS — G301 Alzheimer's disease with late onset: Secondary | ICD-10-CM | POA: Diagnosis not present

## 2021-11-30 DIAGNOSIS — E785 Hyperlipidemia, unspecified: Secondary | ICD-10-CM | POA: Diagnosis not present

## 2021-12-01 DIAGNOSIS — R269 Unspecified abnormalities of gait and mobility: Secondary | ICD-10-CM | POA: Diagnosis not present

## 2021-12-01 DIAGNOSIS — E559 Vitamin D deficiency, unspecified: Secondary | ICD-10-CM | POA: Diagnosis not present

## 2021-12-01 DIAGNOSIS — I129 Hypertensive chronic kidney disease with stage 1 through stage 4 chronic kidney disease, or unspecified chronic kidney disease: Secondary | ICD-10-CM | POA: Diagnosis not present

## 2021-12-01 DIAGNOSIS — F02C0 Dementia in other diseases classified elsewhere, severe, without behavioral disturbance, psychotic disturbance, mood disturbance, and anxiety: Secondary | ICD-10-CM | POA: Diagnosis not present

## 2021-12-01 DIAGNOSIS — G301 Alzheimer's disease with late onset: Secondary | ICD-10-CM | POA: Diagnosis not present

## 2021-12-01 DIAGNOSIS — N1831 Chronic kidney disease, stage 3a: Secondary | ICD-10-CM | POA: Diagnosis not present

## 2021-12-13 DIAGNOSIS — R918 Other nonspecific abnormal finding of lung field: Secondary | ICD-10-CM | POA: Diagnosis not present

## 2021-12-31 DIAGNOSIS — E785 Hyperlipidemia, unspecified: Secondary | ICD-10-CM | POA: Diagnosis not present

## 2021-12-31 DIAGNOSIS — D509 Iron deficiency anemia, unspecified: Secondary | ICD-10-CM | POA: Diagnosis not present

## 2021-12-31 DIAGNOSIS — I739 Peripheral vascular disease, unspecified: Secondary | ICD-10-CM | POA: Diagnosis not present

## 2021-12-31 DIAGNOSIS — R269 Unspecified abnormalities of gait and mobility: Secondary | ICD-10-CM | POA: Diagnosis not present

## 2021-12-31 DIAGNOSIS — F02C0 Dementia in other diseases classified elsewhere, severe, without behavioral disturbance, psychotic disturbance, mood disturbance, and anxiety: Secondary | ICD-10-CM | POA: Diagnosis not present

## 2021-12-31 DIAGNOSIS — R600 Localized edema: Secondary | ICD-10-CM | POA: Diagnosis not present

## 2021-12-31 DIAGNOSIS — N1831 Chronic kidney disease, stage 3a: Secondary | ICD-10-CM | POA: Diagnosis not present

## 2021-12-31 DIAGNOSIS — I129 Hypertensive chronic kidney disease with stage 1 through stage 4 chronic kidney disease, or unspecified chronic kidney disease: Secondary | ICD-10-CM | POA: Diagnosis not present

## 2021-12-31 DIAGNOSIS — E441 Mild protein-calorie malnutrition: Secondary | ICD-10-CM | POA: Diagnosis not present

## 2021-12-31 DIAGNOSIS — G301 Alzheimer's disease with late onset: Secondary | ICD-10-CM | POA: Diagnosis not present

## 2022-01-25 DIAGNOSIS — F02C0 Dementia in other diseases classified elsewhere, severe, without behavioral disturbance, psychotic disturbance, mood disturbance, and anxiety: Secondary | ICD-10-CM | POA: Diagnosis not present

## 2022-01-25 DIAGNOSIS — G301 Alzheimer's disease with late onset: Secondary | ICD-10-CM | POA: Diagnosis not present

## 2022-01-26 DIAGNOSIS — R269 Unspecified abnormalities of gait and mobility: Secondary | ICD-10-CM | POA: Diagnosis not present

## 2022-01-26 DIAGNOSIS — E441 Mild protein-calorie malnutrition: Secondary | ICD-10-CM | POA: Diagnosis not present

## 2022-01-26 DIAGNOSIS — N1831 Chronic kidney disease, stage 3a: Secondary | ICD-10-CM | POA: Diagnosis not present

## 2022-01-26 DIAGNOSIS — D509 Iron deficiency anemia, unspecified: Secondary | ICD-10-CM | POA: Diagnosis not present

## 2022-01-26 DIAGNOSIS — I129 Hypertensive chronic kidney disease with stage 1 through stage 4 chronic kidney disease, or unspecified chronic kidney disease: Secondary | ICD-10-CM | POA: Diagnosis not present

## 2022-01-26 DIAGNOSIS — F02C0 Dementia in other diseases classified elsewhere, severe, without behavioral disturbance, psychotic disturbance, mood disturbance, and anxiety: Secondary | ICD-10-CM | POA: Diagnosis not present

## 2022-01-26 DIAGNOSIS — M6281 Muscle weakness (generalized): Secondary | ICD-10-CM | POA: Diagnosis not present

## 2022-01-26 DIAGNOSIS — G301 Alzheimer's disease with late onset: Secondary | ICD-10-CM | POA: Diagnosis not present

## 2022-01-28 DIAGNOSIS — E441 Mild protein-calorie malnutrition: Secondary | ICD-10-CM | POA: Diagnosis not present

## 2022-01-28 DIAGNOSIS — E785 Hyperlipidemia, unspecified: Secondary | ICD-10-CM | POA: Diagnosis not present

## 2022-02-04 DIAGNOSIS — I739 Peripheral vascular disease, unspecified: Secondary | ICD-10-CM | POA: Diagnosis not present

## 2022-02-04 DIAGNOSIS — D509 Iron deficiency anemia, unspecified: Secondary | ICD-10-CM | POA: Diagnosis not present

## 2022-02-04 DIAGNOSIS — I10 Essential (primary) hypertension: Secondary | ICD-10-CM | POA: Diagnosis not present

## 2022-02-04 DIAGNOSIS — Z6822 Body mass index (BMI) 22.0-22.9, adult: Secondary | ICD-10-CM | POA: Diagnosis not present

## 2022-02-04 DIAGNOSIS — F02C0 Dementia in other diseases classified elsewhere, severe, without behavioral disturbance, psychotic disturbance, mood disturbance, and anxiety: Secondary | ICD-10-CM | POA: Diagnosis not present

## 2022-02-04 DIAGNOSIS — E559 Vitamin D deficiency, unspecified: Secondary | ICD-10-CM | POA: Diagnosis not present

## 2022-02-04 DIAGNOSIS — E441 Mild protein-calorie malnutrition: Secondary | ICD-10-CM | POA: Diagnosis not present

## 2022-02-04 DIAGNOSIS — K219 Gastro-esophageal reflux disease without esophagitis: Secondary | ICD-10-CM | POA: Diagnosis not present

## 2022-02-04 DIAGNOSIS — D631 Anemia in chronic kidney disease: Secondary | ICD-10-CM | POA: Diagnosis not present

## 2022-02-04 DIAGNOSIS — N1831 Chronic kidney disease, stage 3a: Secondary | ICD-10-CM | POA: Diagnosis not present

## 2022-02-04 DIAGNOSIS — E785 Hyperlipidemia, unspecified: Secondary | ICD-10-CM | POA: Diagnosis not present

## 2022-02-04 DIAGNOSIS — Z9181 History of falling: Secondary | ICD-10-CM | POA: Diagnosis not present

## 2022-02-04 DIAGNOSIS — E876 Hypokalemia: Secondary | ICD-10-CM | POA: Diagnosis not present

## 2022-02-04 DIAGNOSIS — G301 Alzheimer's disease with late onset: Secondary | ICD-10-CM | POA: Diagnosis not present

## 2022-02-04 DIAGNOSIS — I129 Hypertensive chronic kidney disease with stage 1 through stage 4 chronic kidney disease, or unspecified chronic kidney disease: Secondary | ICD-10-CM | POA: Diagnosis not present

## 2022-02-08 DIAGNOSIS — I129 Hypertensive chronic kidney disease with stage 1 through stage 4 chronic kidney disease, or unspecified chronic kidney disease: Secondary | ICD-10-CM | POA: Diagnosis not present

## 2022-02-08 DIAGNOSIS — E785 Hyperlipidemia, unspecified: Secondary | ICD-10-CM | POA: Diagnosis not present

## 2022-02-08 DIAGNOSIS — N1831 Chronic kidney disease, stage 3a: Secondary | ICD-10-CM | POA: Diagnosis not present

## 2022-02-08 DIAGNOSIS — Z9181 History of falling: Secondary | ICD-10-CM | POA: Diagnosis not present

## 2022-02-08 DIAGNOSIS — I739 Peripheral vascular disease, unspecified: Secondary | ICD-10-CM | POA: Diagnosis not present

## 2022-02-08 DIAGNOSIS — K219 Gastro-esophageal reflux disease without esophagitis: Secondary | ICD-10-CM | POA: Diagnosis not present

## 2022-02-08 DIAGNOSIS — E559 Vitamin D deficiency, unspecified: Secondary | ICD-10-CM | POA: Diagnosis not present

## 2022-02-08 DIAGNOSIS — D509 Iron deficiency anemia, unspecified: Secondary | ICD-10-CM | POA: Diagnosis not present

## 2022-02-08 DIAGNOSIS — G301 Alzheimer's disease with late onset: Secondary | ICD-10-CM | POA: Diagnosis not present

## 2022-02-08 DIAGNOSIS — E441 Mild protein-calorie malnutrition: Secondary | ICD-10-CM | POA: Diagnosis not present

## 2022-02-08 DIAGNOSIS — D631 Anemia in chronic kidney disease: Secondary | ICD-10-CM | POA: Diagnosis not present

## 2022-02-08 DIAGNOSIS — I10 Essential (primary) hypertension: Secondary | ICD-10-CM | POA: Diagnosis not present

## 2022-02-08 DIAGNOSIS — Z6822 Body mass index (BMI) 22.0-22.9, adult: Secondary | ICD-10-CM | POA: Diagnosis not present

## 2022-02-08 DIAGNOSIS — F02C0 Dementia in other diseases classified elsewhere, severe, without behavioral disturbance, psychotic disturbance, mood disturbance, and anxiety: Secondary | ICD-10-CM | POA: Diagnosis not present

## 2022-02-08 DIAGNOSIS — E876 Hypokalemia: Secondary | ICD-10-CM | POA: Diagnosis not present

## 2022-02-10 DIAGNOSIS — D509 Iron deficiency anemia, unspecified: Secondary | ICD-10-CM | POA: Diagnosis not present

## 2022-02-10 DIAGNOSIS — E559 Vitamin D deficiency, unspecified: Secondary | ICD-10-CM | POA: Diagnosis not present

## 2022-02-10 DIAGNOSIS — E785 Hyperlipidemia, unspecified: Secondary | ICD-10-CM | POA: Diagnosis not present

## 2022-02-10 DIAGNOSIS — Z6822 Body mass index (BMI) 22.0-22.9, adult: Secondary | ICD-10-CM | POA: Diagnosis not present

## 2022-02-10 DIAGNOSIS — K219 Gastro-esophageal reflux disease without esophagitis: Secondary | ICD-10-CM | POA: Diagnosis not present

## 2022-02-10 DIAGNOSIS — I739 Peripheral vascular disease, unspecified: Secondary | ICD-10-CM | POA: Diagnosis not present

## 2022-02-10 DIAGNOSIS — E441 Mild protein-calorie malnutrition: Secondary | ICD-10-CM | POA: Diagnosis not present

## 2022-02-10 DIAGNOSIS — E876 Hypokalemia: Secondary | ICD-10-CM | POA: Diagnosis not present

## 2022-02-10 DIAGNOSIS — D631 Anemia in chronic kidney disease: Secondary | ICD-10-CM | POA: Diagnosis not present

## 2022-02-10 DIAGNOSIS — N1831 Chronic kidney disease, stage 3a: Secondary | ICD-10-CM | POA: Diagnosis not present

## 2022-02-10 DIAGNOSIS — I129 Hypertensive chronic kidney disease with stage 1 through stage 4 chronic kidney disease, or unspecified chronic kidney disease: Secondary | ICD-10-CM | POA: Diagnosis not present

## 2022-02-10 DIAGNOSIS — F02C0 Dementia in other diseases classified elsewhere, severe, without behavioral disturbance, psychotic disturbance, mood disturbance, and anxiety: Secondary | ICD-10-CM | POA: Diagnosis not present

## 2022-02-10 DIAGNOSIS — Z9181 History of falling: Secondary | ICD-10-CM | POA: Diagnosis not present

## 2022-02-10 DIAGNOSIS — G301 Alzheimer's disease with late onset: Secondary | ICD-10-CM | POA: Diagnosis not present

## 2022-02-10 DIAGNOSIS — I10 Essential (primary) hypertension: Secondary | ICD-10-CM | POA: Diagnosis not present

## 2022-02-15 DIAGNOSIS — F02C0 Dementia in other diseases classified elsewhere, severe, without behavioral disturbance, psychotic disturbance, mood disturbance, and anxiety: Secondary | ICD-10-CM | POA: Diagnosis not present

## 2022-02-15 DIAGNOSIS — G301 Alzheimer's disease with late onset: Secondary | ICD-10-CM | POA: Diagnosis not present

## 2022-02-16 DIAGNOSIS — I129 Hypertensive chronic kidney disease with stage 1 through stage 4 chronic kidney disease, or unspecified chronic kidney disease: Secondary | ICD-10-CM | POA: Diagnosis not present

## 2022-02-16 DIAGNOSIS — N1831 Chronic kidney disease, stage 3a: Secondary | ICD-10-CM | POA: Diagnosis not present

## 2022-02-16 DIAGNOSIS — G301 Alzheimer's disease with late onset: Secondary | ICD-10-CM | POA: Diagnosis not present

## 2022-02-16 DIAGNOSIS — D631 Anemia in chronic kidney disease: Secondary | ICD-10-CM | POA: Diagnosis not present

## 2022-02-16 DIAGNOSIS — I739 Peripheral vascular disease, unspecified: Secondary | ICD-10-CM | POA: Diagnosis not present

## 2022-02-16 DIAGNOSIS — Z6822 Body mass index (BMI) 22.0-22.9, adult: Secondary | ICD-10-CM | POA: Diagnosis not present

## 2022-02-16 DIAGNOSIS — E559 Vitamin D deficiency, unspecified: Secondary | ICD-10-CM | POA: Diagnosis not present

## 2022-02-16 DIAGNOSIS — I10 Essential (primary) hypertension: Secondary | ICD-10-CM | POA: Diagnosis not present

## 2022-02-16 DIAGNOSIS — E441 Mild protein-calorie malnutrition: Secondary | ICD-10-CM | POA: Diagnosis not present

## 2022-02-16 DIAGNOSIS — E876 Hypokalemia: Secondary | ICD-10-CM | POA: Diagnosis not present

## 2022-02-16 DIAGNOSIS — E785 Hyperlipidemia, unspecified: Secondary | ICD-10-CM | POA: Diagnosis not present

## 2022-02-16 DIAGNOSIS — F02C0 Dementia in other diseases classified elsewhere, severe, without behavioral disturbance, psychotic disturbance, mood disturbance, and anxiety: Secondary | ICD-10-CM | POA: Diagnosis not present

## 2022-02-16 DIAGNOSIS — D509 Iron deficiency anemia, unspecified: Secondary | ICD-10-CM | POA: Diagnosis not present

## 2022-02-16 DIAGNOSIS — K219 Gastro-esophageal reflux disease without esophagitis: Secondary | ICD-10-CM | POA: Diagnosis not present

## 2022-02-16 DIAGNOSIS — Z9181 History of falling: Secondary | ICD-10-CM | POA: Diagnosis not present

## 2022-02-19 DIAGNOSIS — E785 Hyperlipidemia, unspecified: Secondary | ICD-10-CM | POA: Diagnosis not present

## 2022-02-19 DIAGNOSIS — E441 Mild protein-calorie malnutrition: Secondary | ICD-10-CM | POA: Diagnosis not present

## 2022-02-22 DIAGNOSIS — G301 Alzheimer's disease with late onset: Secondary | ICD-10-CM | POA: Diagnosis not present

## 2022-02-22 DIAGNOSIS — E441 Mild protein-calorie malnutrition: Secondary | ICD-10-CM | POA: Diagnosis not present

## 2022-02-22 DIAGNOSIS — F02C0 Dementia in other diseases classified elsewhere, severe, without behavioral disturbance, psychotic disturbance, mood disturbance, and anxiety: Secondary | ICD-10-CM | POA: Diagnosis not present

## 2022-02-22 DIAGNOSIS — E559 Vitamin D deficiency, unspecified: Secondary | ICD-10-CM | POA: Diagnosis not present

## 2022-02-22 DIAGNOSIS — I129 Hypertensive chronic kidney disease with stage 1 through stage 4 chronic kidney disease, or unspecified chronic kidney disease: Secondary | ICD-10-CM | POA: Diagnosis not present

## 2022-02-22 DIAGNOSIS — D631 Anemia in chronic kidney disease: Secondary | ICD-10-CM | POA: Diagnosis not present

## 2022-02-22 DIAGNOSIS — N1831 Chronic kidney disease, stage 3a: Secondary | ICD-10-CM | POA: Diagnosis not present

## 2022-02-22 DIAGNOSIS — D509 Iron deficiency anemia, unspecified: Secondary | ICD-10-CM | POA: Diagnosis not present

## 2022-02-22 DIAGNOSIS — I739 Peripheral vascular disease, unspecified: Secondary | ICD-10-CM | POA: Diagnosis not present

## 2022-02-22 DIAGNOSIS — Z9181 History of falling: Secondary | ICD-10-CM | POA: Diagnosis not present

## 2022-02-22 DIAGNOSIS — K219 Gastro-esophageal reflux disease without esophagitis: Secondary | ICD-10-CM | POA: Diagnosis not present

## 2022-02-22 DIAGNOSIS — E876 Hypokalemia: Secondary | ICD-10-CM | POA: Diagnosis not present

## 2022-02-22 DIAGNOSIS — E785 Hyperlipidemia, unspecified: Secondary | ICD-10-CM | POA: Diagnosis not present

## 2022-02-22 DIAGNOSIS — Z6822 Body mass index (BMI) 22.0-22.9, adult: Secondary | ICD-10-CM | POA: Diagnosis not present

## 2022-02-22 DIAGNOSIS — I10 Essential (primary) hypertension: Secondary | ICD-10-CM | POA: Diagnosis not present

## 2022-02-23 DIAGNOSIS — G301 Alzheimer's disease with late onset: Secondary | ICD-10-CM | POA: Diagnosis not present

## 2022-02-23 DIAGNOSIS — I739 Peripheral vascular disease, unspecified: Secondary | ICD-10-CM | POA: Diagnosis not present

## 2022-02-23 DIAGNOSIS — N1831 Chronic kidney disease, stage 3a: Secondary | ICD-10-CM | POA: Diagnosis not present

## 2022-02-23 DIAGNOSIS — E782 Mixed hyperlipidemia: Secondary | ICD-10-CM | POA: Diagnosis not present

## 2022-02-23 DIAGNOSIS — F02C0 Dementia in other diseases classified elsewhere, severe, without behavioral disturbance, psychotic disturbance, mood disturbance, and anxiety: Secondary | ICD-10-CM | POA: Diagnosis not present

## 2022-02-23 DIAGNOSIS — R279 Unspecified lack of coordination: Secondary | ICD-10-CM | POA: Diagnosis not present

## 2022-02-23 DIAGNOSIS — E441 Mild protein-calorie malnutrition: Secondary | ICD-10-CM | POA: Diagnosis not present

## 2022-02-23 DIAGNOSIS — I129 Hypertensive chronic kidney disease with stage 1 through stage 4 chronic kidney disease, or unspecified chronic kidney disease: Secondary | ICD-10-CM | POA: Diagnosis not present

## 2022-03-01 DIAGNOSIS — D509 Iron deficiency anemia, unspecified: Secondary | ICD-10-CM | POA: Diagnosis not present

## 2022-03-01 DIAGNOSIS — D631 Anemia in chronic kidney disease: Secondary | ICD-10-CM | POA: Diagnosis not present

## 2022-03-01 DIAGNOSIS — I129 Hypertensive chronic kidney disease with stage 1 through stage 4 chronic kidney disease, or unspecified chronic kidney disease: Secondary | ICD-10-CM | POA: Diagnosis not present

## 2022-03-01 DIAGNOSIS — N1831 Chronic kidney disease, stage 3a: Secondary | ICD-10-CM | POA: Diagnosis not present

## 2022-03-01 DIAGNOSIS — I10 Essential (primary) hypertension: Secondary | ICD-10-CM | POA: Diagnosis not present

## 2022-03-01 DIAGNOSIS — F02C0 Dementia in other diseases classified elsewhere, severe, without behavioral disturbance, psychotic disturbance, mood disturbance, and anxiety: Secondary | ICD-10-CM | POA: Diagnosis not present

## 2022-03-01 DIAGNOSIS — K219 Gastro-esophageal reflux disease without esophagitis: Secondary | ICD-10-CM | POA: Diagnosis not present

## 2022-03-01 DIAGNOSIS — Z9181 History of falling: Secondary | ICD-10-CM | POA: Diagnosis not present

## 2022-03-01 DIAGNOSIS — I739 Peripheral vascular disease, unspecified: Secondary | ICD-10-CM | POA: Diagnosis not present

## 2022-03-01 DIAGNOSIS — G301 Alzheimer's disease with late onset: Secondary | ICD-10-CM | POA: Diagnosis not present

## 2022-03-01 DIAGNOSIS — E559 Vitamin D deficiency, unspecified: Secondary | ICD-10-CM | POA: Diagnosis not present

## 2022-03-01 DIAGNOSIS — E785 Hyperlipidemia, unspecified: Secondary | ICD-10-CM | POA: Diagnosis not present

## 2022-03-01 DIAGNOSIS — E876 Hypokalemia: Secondary | ICD-10-CM | POA: Diagnosis not present

## 2022-03-01 DIAGNOSIS — E441 Mild protein-calorie malnutrition: Secondary | ICD-10-CM | POA: Diagnosis not present

## 2022-03-01 DIAGNOSIS — Z6822 Body mass index (BMI) 22.0-22.9, adult: Secondary | ICD-10-CM | POA: Diagnosis not present

## 2022-03-05 DIAGNOSIS — G301 Alzheimer's disease with late onset: Secondary | ICD-10-CM | POA: Diagnosis not present

## 2022-03-05 DIAGNOSIS — Z9181 History of falling: Secondary | ICD-10-CM | POA: Diagnosis not present

## 2022-03-05 DIAGNOSIS — I10 Essential (primary) hypertension: Secondary | ICD-10-CM | POA: Diagnosis not present

## 2022-03-05 DIAGNOSIS — F02C0 Dementia in other diseases classified elsewhere, severe, without behavioral disturbance, psychotic disturbance, mood disturbance, and anxiety: Secondary | ICD-10-CM | POA: Diagnosis not present

## 2022-03-05 DIAGNOSIS — E785 Hyperlipidemia, unspecified: Secondary | ICD-10-CM | POA: Diagnosis not present

## 2022-03-05 DIAGNOSIS — D509 Iron deficiency anemia, unspecified: Secondary | ICD-10-CM | POA: Diagnosis not present

## 2022-03-05 DIAGNOSIS — Z6822 Body mass index (BMI) 22.0-22.9, adult: Secondary | ICD-10-CM | POA: Diagnosis not present

## 2022-03-05 DIAGNOSIS — I129 Hypertensive chronic kidney disease with stage 1 through stage 4 chronic kidney disease, or unspecified chronic kidney disease: Secondary | ICD-10-CM | POA: Diagnosis not present

## 2022-03-05 DIAGNOSIS — E876 Hypokalemia: Secondary | ICD-10-CM | POA: Diagnosis not present

## 2022-03-05 DIAGNOSIS — E559 Vitamin D deficiency, unspecified: Secondary | ICD-10-CM | POA: Diagnosis not present

## 2022-03-05 DIAGNOSIS — I739 Peripheral vascular disease, unspecified: Secondary | ICD-10-CM | POA: Diagnosis not present

## 2022-03-05 DIAGNOSIS — K219 Gastro-esophageal reflux disease without esophagitis: Secondary | ICD-10-CM | POA: Diagnosis not present

## 2022-03-05 DIAGNOSIS — N1831 Chronic kidney disease, stage 3a: Secondary | ICD-10-CM | POA: Diagnosis not present

## 2022-03-05 DIAGNOSIS — E441 Mild protein-calorie malnutrition: Secondary | ICD-10-CM | POA: Diagnosis not present

## 2022-03-05 DIAGNOSIS — D631 Anemia in chronic kidney disease: Secondary | ICD-10-CM | POA: Diagnosis not present

## 2022-03-08 DIAGNOSIS — E441 Mild protein-calorie malnutrition: Secondary | ICD-10-CM | POA: Diagnosis not present

## 2022-03-08 DIAGNOSIS — D631 Anemia in chronic kidney disease: Secondary | ICD-10-CM | POA: Diagnosis not present

## 2022-03-08 DIAGNOSIS — K219 Gastro-esophageal reflux disease without esophagitis: Secondary | ICD-10-CM | POA: Diagnosis not present

## 2022-03-08 DIAGNOSIS — I10 Essential (primary) hypertension: Secondary | ICD-10-CM | POA: Diagnosis not present

## 2022-03-08 DIAGNOSIS — D509 Iron deficiency anemia, unspecified: Secondary | ICD-10-CM | POA: Diagnosis not present

## 2022-03-08 DIAGNOSIS — E785 Hyperlipidemia, unspecified: Secondary | ICD-10-CM | POA: Diagnosis not present

## 2022-03-08 DIAGNOSIS — I129 Hypertensive chronic kidney disease with stage 1 through stage 4 chronic kidney disease, or unspecified chronic kidney disease: Secondary | ICD-10-CM | POA: Diagnosis not present

## 2022-03-08 DIAGNOSIS — E559 Vitamin D deficiency, unspecified: Secondary | ICD-10-CM | POA: Diagnosis not present

## 2022-03-08 DIAGNOSIS — N1831 Chronic kidney disease, stage 3a: Secondary | ICD-10-CM | POA: Diagnosis not present

## 2022-03-08 DIAGNOSIS — G301 Alzheimer's disease with late onset: Secondary | ICD-10-CM | POA: Diagnosis not present

## 2022-03-08 DIAGNOSIS — F02C0 Dementia in other diseases classified elsewhere, severe, without behavioral disturbance, psychotic disturbance, mood disturbance, and anxiety: Secondary | ICD-10-CM | POA: Diagnosis not present

## 2022-03-08 DIAGNOSIS — Z9181 History of falling: Secondary | ICD-10-CM | POA: Diagnosis not present

## 2022-03-08 DIAGNOSIS — Z6822 Body mass index (BMI) 22.0-22.9, adult: Secondary | ICD-10-CM | POA: Diagnosis not present

## 2022-03-08 DIAGNOSIS — E876 Hypokalemia: Secondary | ICD-10-CM | POA: Diagnosis not present

## 2022-03-08 DIAGNOSIS — I739 Peripheral vascular disease, unspecified: Secondary | ICD-10-CM | POA: Diagnosis not present

## 2022-03-09 DIAGNOSIS — G301 Alzheimer's disease with late onset: Secondary | ICD-10-CM | POA: Diagnosis not present

## 2022-03-09 DIAGNOSIS — N1831 Chronic kidney disease, stage 3a: Secondary | ICD-10-CM | POA: Diagnosis not present

## 2022-03-09 DIAGNOSIS — D509 Iron deficiency anemia, unspecified: Secondary | ICD-10-CM | POA: Diagnosis not present

## 2022-03-09 DIAGNOSIS — Z6822 Body mass index (BMI) 22.0-22.9, adult: Secondary | ICD-10-CM | POA: Diagnosis not present

## 2022-03-09 DIAGNOSIS — E441 Mild protein-calorie malnutrition: Secondary | ICD-10-CM | POA: Diagnosis not present

## 2022-03-09 DIAGNOSIS — I10 Essential (primary) hypertension: Secondary | ICD-10-CM | POA: Diagnosis not present

## 2022-03-09 DIAGNOSIS — D631 Anemia in chronic kidney disease: Secondary | ICD-10-CM | POA: Diagnosis not present

## 2022-03-09 DIAGNOSIS — E876 Hypokalemia: Secondary | ICD-10-CM | POA: Diagnosis not present

## 2022-03-09 DIAGNOSIS — E785 Hyperlipidemia, unspecified: Secondary | ICD-10-CM | POA: Diagnosis not present

## 2022-03-09 DIAGNOSIS — F02C0 Dementia in other diseases classified elsewhere, severe, without behavioral disturbance, psychotic disturbance, mood disturbance, and anxiety: Secondary | ICD-10-CM | POA: Diagnosis not present

## 2022-03-09 DIAGNOSIS — Z9181 History of falling: Secondary | ICD-10-CM | POA: Diagnosis not present

## 2022-03-09 DIAGNOSIS — I129 Hypertensive chronic kidney disease with stage 1 through stage 4 chronic kidney disease, or unspecified chronic kidney disease: Secondary | ICD-10-CM | POA: Diagnosis not present

## 2022-03-09 DIAGNOSIS — I739 Peripheral vascular disease, unspecified: Secondary | ICD-10-CM | POA: Diagnosis not present

## 2022-03-09 DIAGNOSIS — E559 Vitamin D deficiency, unspecified: Secondary | ICD-10-CM | POA: Diagnosis not present

## 2022-03-09 DIAGNOSIS — K219 Gastro-esophageal reflux disease without esophagitis: Secondary | ICD-10-CM | POA: Diagnosis not present

## 2022-03-15 DIAGNOSIS — E441 Mild protein-calorie malnutrition: Secondary | ICD-10-CM | POA: Diagnosis not present

## 2022-03-15 DIAGNOSIS — E785 Hyperlipidemia, unspecified: Secondary | ICD-10-CM | POA: Diagnosis not present

## 2022-03-15 DIAGNOSIS — K219 Gastro-esophageal reflux disease without esophagitis: Secondary | ICD-10-CM | POA: Diagnosis not present

## 2022-03-15 DIAGNOSIS — E876 Hypokalemia: Secondary | ICD-10-CM | POA: Diagnosis not present

## 2022-03-15 DIAGNOSIS — N1831 Chronic kidney disease, stage 3a: Secondary | ICD-10-CM | POA: Diagnosis not present

## 2022-03-15 DIAGNOSIS — D631 Anemia in chronic kidney disease: Secondary | ICD-10-CM | POA: Diagnosis not present

## 2022-03-15 DIAGNOSIS — I129 Hypertensive chronic kidney disease with stage 1 through stage 4 chronic kidney disease, or unspecified chronic kidney disease: Secondary | ICD-10-CM | POA: Diagnosis not present

## 2022-03-15 DIAGNOSIS — D509 Iron deficiency anemia, unspecified: Secondary | ICD-10-CM | POA: Diagnosis not present

## 2022-03-15 DIAGNOSIS — Z9181 History of falling: Secondary | ICD-10-CM | POA: Diagnosis not present

## 2022-03-15 DIAGNOSIS — Z6822 Body mass index (BMI) 22.0-22.9, adult: Secondary | ICD-10-CM | POA: Diagnosis not present

## 2022-03-15 DIAGNOSIS — I739 Peripheral vascular disease, unspecified: Secondary | ICD-10-CM | POA: Diagnosis not present

## 2022-03-15 DIAGNOSIS — E559 Vitamin D deficiency, unspecified: Secondary | ICD-10-CM | POA: Diagnosis not present

## 2022-03-15 DIAGNOSIS — G301 Alzheimer's disease with late onset: Secondary | ICD-10-CM | POA: Diagnosis not present

## 2022-03-15 DIAGNOSIS — F02C0 Dementia in other diseases classified elsewhere, severe, without behavioral disturbance, psychotic disturbance, mood disturbance, and anxiety: Secondary | ICD-10-CM | POA: Diagnosis not present

## 2022-03-15 DIAGNOSIS — I10 Essential (primary) hypertension: Secondary | ICD-10-CM | POA: Diagnosis not present

## 2022-03-19 DIAGNOSIS — Z9181 History of falling: Secondary | ICD-10-CM | POA: Diagnosis not present

## 2022-03-19 DIAGNOSIS — E876 Hypokalemia: Secondary | ICD-10-CM | POA: Diagnosis not present

## 2022-03-19 DIAGNOSIS — I10 Essential (primary) hypertension: Secondary | ICD-10-CM | POA: Diagnosis not present

## 2022-03-19 DIAGNOSIS — I129 Hypertensive chronic kidney disease with stage 1 through stage 4 chronic kidney disease, or unspecified chronic kidney disease: Secondary | ICD-10-CM | POA: Diagnosis not present

## 2022-03-19 DIAGNOSIS — F02C0 Dementia in other diseases classified elsewhere, severe, without behavioral disturbance, psychotic disturbance, mood disturbance, and anxiety: Secondary | ICD-10-CM | POA: Diagnosis not present

## 2022-03-19 DIAGNOSIS — E785 Hyperlipidemia, unspecified: Secondary | ICD-10-CM | POA: Diagnosis not present

## 2022-03-19 DIAGNOSIS — D509 Iron deficiency anemia, unspecified: Secondary | ICD-10-CM | POA: Diagnosis not present

## 2022-03-19 DIAGNOSIS — Z6822 Body mass index (BMI) 22.0-22.9, adult: Secondary | ICD-10-CM | POA: Diagnosis not present

## 2022-03-19 DIAGNOSIS — E441 Mild protein-calorie malnutrition: Secondary | ICD-10-CM | POA: Diagnosis not present

## 2022-03-19 DIAGNOSIS — N1831 Chronic kidney disease, stage 3a: Secondary | ICD-10-CM | POA: Diagnosis not present

## 2022-03-19 DIAGNOSIS — G301 Alzheimer's disease with late onset: Secondary | ICD-10-CM | POA: Diagnosis not present

## 2022-03-19 DIAGNOSIS — D631 Anemia in chronic kidney disease: Secondary | ICD-10-CM | POA: Diagnosis not present

## 2022-03-19 DIAGNOSIS — K219 Gastro-esophageal reflux disease without esophagitis: Secondary | ICD-10-CM | POA: Diagnosis not present

## 2022-03-19 DIAGNOSIS — I739 Peripheral vascular disease, unspecified: Secondary | ICD-10-CM | POA: Diagnosis not present

## 2022-03-19 DIAGNOSIS — E559 Vitamin D deficiency, unspecified: Secondary | ICD-10-CM | POA: Diagnosis not present

## 2022-03-22 DIAGNOSIS — I739 Peripheral vascular disease, unspecified: Secondary | ICD-10-CM | POA: Diagnosis not present

## 2022-03-22 DIAGNOSIS — F02C0 Dementia in other diseases classified elsewhere, severe, without behavioral disturbance, psychotic disturbance, mood disturbance, and anxiety: Secondary | ICD-10-CM | POA: Diagnosis not present

## 2022-03-22 DIAGNOSIS — E441 Mild protein-calorie malnutrition: Secondary | ICD-10-CM | POA: Diagnosis not present

## 2022-03-22 DIAGNOSIS — I129 Hypertensive chronic kidney disease with stage 1 through stage 4 chronic kidney disease, or unspecified chronic kidney disease: Secondary | ICD-10-CM | POA: Diagnosis not present

## 2022-03-22 DIAGNOSIS — D631 Anemia in chronic kidney disease: Secondary | ICD-10-CM | POA: Diagnosis not present

## 2022-03-22 DIAGNOSIS — E559 Vitamin D deficiency, unspecified: Secondary | ICD-10-CM | POA: Diagnosis not present

## 2022-03-22 DIAGNOSIS — E785 Hyperlipidemia, unspecified: Secondary | ICD-10-CM | POA: Diagnosis not present

## 2022-03-22 DIAGNOSIS — Z6822 Body mass index (BMI) 22.0-22.9, adult: Secondary | ICD-10-CM | POA: Diagnosis not present

## 2022-03-22 DIAGNOSIS — E876 Hypokalemia: Secondary | ICD-10-CM | POA: Diagnosis not present

## 2022-03-22 DIAGNOSIS — N1831 Chronic kidney disease, stage 3a: Secondary | ICD-10-CM | POA: Diagnosis not present

## 2022-03-22 DIAGNOSIS — Z9181 History of falling: Secondary | ICD-10-CM | POA: Diagnosis not present

## 2022-03-22 DIAGNOSIS — K219 Gastro-esophageal reflux disease without esophagitis: Secondary | ICD-10-CM | POA: Diagnosis not present

## 2022-03-22 DIAGNOSIS — D509 Iron deficiency anemia, unspecified: Secondary | ICD-10-CM | POA: Diagnosis not present

## 2022-03-22 DIAGNOSIS — I10 Essential (primary) hypertension: Secondary | ICD-10-CM | POA: Diagnosis not present

## 2022-03-22 DIAGNOSIS — G301 Alzheimer's disease with late onset: Secondary | ICD-10-CM | POA: Diagnosis not present

## 2022-03-23 DIAGNOSIS — E782 Mixed hyperlipidemia: Secondary | ICD-10-CM | POA: Diagnosis not present

## 2022-03-23 DIAGNOSIS — I129 Hypertensive chronic kidney disease with stage 1 through stage 4 chronic kidney disease, or unspecified chronic kidney disease: Secondary | ICD-10-CM | POA: Diagnosis not present

## 2022-03-23 DIAGNOSIS — N1831 Chronic kidney disease, stage 3a: Secondary | ICD-10-CM | POA: Diagnosis not present

## 2022-03-23 DIAGNOSIS — I739 Peripheral vascular disease, unspecified: Secondary | ICD-10-CM | POA: Diagnosis not present

## 2022-03-23 DIAGNOSIS — R269 Unspecified abnormalities of gait and mobility: Secondary | ICD-10-CM | POA: Diagnosis not present

## 2022-03-23 DIAGNOSIS — G301 Alzheimer's disease with late onset: Secondary | ICD-10-CM | POA: Diagnosis not present

## 2022-03-23 DIAGNOSIS — E441 Mild protein-calorie malnutrition: Secondary | ICD-10-CM | POA: Diagnosis not present

## 2022-03-23 DIAGNOSIS — F02C0 Dementia in other diseases classified elsewhere, severe, without behavioral disturbance, psychotic disturbance, mood disturbance, and anxiety: Secondary | ICD-10-CM | POA: Diagnosis not present

## 2022-03-24 DIAGNOSIS — I10 Essential (primary) hypertension: Secondary | ICD-10-CM | POA: Diagnosis not present

## 2022-03-24 DIAGNOSIS — E876 Hypokalemia: Secondary | ICD-10-CM | POA: Diagnosis not present

## 2022-03-24 DIAGNOSIS — E559 Vitamin D deficiency, unspecified: Secondary | ICD-10-CM | POA: Diagnosis not present

## 2022-03-24 DIAGNOSIS — I129 Hypertensive chronic kidney disease with stage 1 through stage 4 chronic kidney disease, or unspecified chronic kidney disease: Secondary | ICD-10-CM | POA: Diagnosis not present

## 2022-03-24 DIAGNOSIS — N1831 Chronic kidney disease, stage 3a: Secondary | ICD-10-CM | POA: Diagnosis not present

## 2022-03-24 DIAGNOSIS — I739 Peripheral vascular disease, unspecified: Secondary | ICD-10-CM | POA: Diagnosis not present

## 2022-03-24 DIAGNOSIS — G301 Alzheimer's disease with late onset: Secondary | ICD-10-CM | POA: Diagnosis not present

## 2022-03-24 DIAGNOSIS — E441 Mild protein-calorie malnutrition: Secondary | ICD-10-CM | POA: Diagnosis not present

## 2022-03-24 DIAGNOSIS — D631 Anemia in chronic kidney disease: Secondary | ICD-10-CM | POA: Diagnosis not present

## 2022-03-24 DIAGNOSIS — K219 Gastro-esophageal reflux disease without esophagitis: Secondary | ICD-10-CM | POA: Diagnosis not present

## 2022-03-24 DIAGNOSIS — Z6822 Body mass index (BMI) 22.0-22.9, adult: Secondary | ICD-10-CM | POA: Diagnosis not present

## 2022-03-24 DIAGNOSIS — Z9181 History of falling: Secondary | ICD-10-CM | POA: Diagnosis not present

## 2022-03-24 DIAGNOSIS — D509 Iron deficiency anemia, unspecified: Secondary | ICD-10-CM | POA: Diagnosis not present

## 2022-03-24 DIAGNOSIS — E785 Hyperlipidemia, unspecified: Secondary | ICD-10-CM | POA: Diagnosis not present

## 2022-03-24 DIAGNOSIS — F02C0 Dementia in other diseases classified elsewhere, severe, without behavioral disturbance, psychotic disturbance, mood disturbance, and anxiety: Secondary | ICD-10-CM | POA: Diagnosis not present

## 2022-03-31 DIAGNOSIS — N1831 Chronic kidney disease, stage 3a: Secondary | ICD-10-CM | POA: Diagnosis not present

## 2022-03-31 DIAGNOSIS — E441 Mild protein-calorie malnutrition: Secondary | ICD-10-CM | POA: Diagnosis not present

## 2022-03-31 DIAGNOSIS — E876 Hypokalemia: Secondary | ICD-10-CM | POA: Diagnosis not present

## 2022-03-31 DIAGNOSIS — E785 Hyperlipidemia, unspecified: Secondary | ICD-10-CM | POA: Diagnosis not present

## 2022-03-31 DIAGNOSIS — D509 Iron deficiency anemia, unspecified: Secondary | ICD-10-CM | POA: Diagnosis not present

## 2022-03-31 DIAGNOSIS — Z9181 History of falling: Secondary | ICD-10-CM | POA: Diagnosis not present

## 2022-03-31 DIAGNOSIS — I10 Essential (primary) hypertension: Secondary | ICD-10-CM | POA: Diagnosis not present

## 2022-03-31 DIAGNOSIS — K219 Gastro-esophageal reflux disease without esophagitis: Secondary | ICD-10-CM | POA: Diagnosis not present

## 2022-03-31 DIAGNOSIS — G301 Alzheimer's disease with late onset: Secondary | ICD-10-CM | POA: Diagnosis not present

## 2022-03-31 DIAGNOSIS — Z6822 Body mass index (BMI) 22.0-22.9, adult: Secondary | ICD-10-CM | POA: Diagnosis not present

## 2022-03-31 DIAGNOSIS — F02C0 Dementia in other diseases classified elsewhere, severe, without behavioral disturbance, psychotic disturbance, mood disturbance, and anxiety: Secondary | ICD-10-CM | POA: Diagnosis not present

## 2022-03-31 DIAGNOSIS — I129 Hypertensive chronic kidney disease with stage 1 through stage 4 chronic kidney disease, or unspecified chronic kidney disease: Secondary | ICD-10-CM | POA: Diagnosis not present

## 2022-03-31 DIAGNOSIS — I739 Peripheral vascular disease, unspecified: Secondary | ICD-10-CM | POA: Diagnosis not present

## 2022-03-31 DIAGNOSIS — E559 Vitamin D deficiency, unspecified: Secondary | ICD-10-CM | POA: Diagnosis not present

## 2022-03-31 DIAGNOSIS — D631 Anemia in chronic kidney disease: Secondary | ICD-10-CM | POA: Diagnosis not present

## 2022-04-01 DIAGNOSIS — Z6822 Body mass index (BMI) 22.0-22.9, adult: Secondary | ICD-10-CM | POA: Diagnosis not present

## 2022-04-01 DIAGNOSIS — E876 Hypokalemia: Secondary | ICD-10-CM | POA: Diagnosis not present

## 2022-04-01 DIAGNOSIS — K219 Gastro-esophageal reflux disease without esophagitis: Secondary | ICD-10-CM | POA: Diagnosis not present

## 2022-04-01 DIAGNOSIS — E559 Vitamin D deficiency, unspecified: Secondary | ICD-10-CM | POA: Diagnosis not present

## 2022-04-01 DIAGNOSIS — D631 Anemia in chronic kidney disease: Secondary | ICD-10-CM | POA: Diagnosis not present

## 2022-04-01 DIAGNOSIS — D509 Iron deficiency anemia, unspecified: Secondary | ICD-10-CM | POA: Diagnosis not present

## 2022-04-01 DIAGNOSIS — I10 Essential (primary) hypertension: Secondary | ICD-10-CM | POA: Diagnosis not present

## 2022-04-01 DIAGNOSIS — E785 Hyperlipidemia, unspecified: Secondary | ICD-10-CM | POA: Diagnosis not present

## 2022-04-01 DIAGNOSIS — Z9181 History of falling: Secondary | ICD-10-CM | POA: Diagnosis not present

## 2022-04-01 DIAGNOSIS — E441 Mild protein-calorie malnutrition: Secondary | ICD-10-CM | POA: Diagnosis not present

## 2022-04-01 DIAGNOSIS — I129 Hypertensive chronic kidney disease with stage 1 through stage 4 chronic kidney disease, or unspecified chronic kidney disease: Secondary | ICD-10-CM | POA: Diagnosis not present

## 2022-04-01 DIAGNOSIS — I739 Peripheral vascular disease, unspecified: Secondary | ICD-10-CM | POA: Diagnosis not present

## 2022-04-01 DIAGNOSIS — F02C0 Dementia in other diseases classified elsewhere, severe, without behavioral disturbance, psychotic disturbance, mood disturbance, and anxiety: Secondary | ICD-10-CM | POA: Diagnosis not present

## 2022-04-01 DIAGNOSIS — N1831 Chronic kidney disease, stage 3a: Secondary | ICD-10-CM | POA: Diagnosis not present

## 2022-04-01 DIAGNOSIS — G301 Alzheimer's disease with late onset: Secondary | ICD-10-CM | POA: Diagnosis not present

## 2022-04-02 DIAGNOSIS — E785 Hyperlipidemia, unspecified: Secondary | ICD-10-CM | POA: Diagnosis not present

## 2022-04-02 DIAGNOSIS — E441 Mild protein-calorie malnutrition: Secondary | ICD-10-CM | POA: Diagnosis not present

## 2022-04-05 DIAGNOSIS — N1831 Chronic kidney disease, stage 3a: Secondary | ICD-10-CM | POA: Diagnosis not present

## 2022-04-05 DIAGNOSIS — F02C0 Dementia in other diseases classified elsewhere, severe, without behavioral disturbance, psychotic disturbance, mood disturbance, and anxiety: Secondary | ICD-10-CM | POA: Diagnosis not present

## 2022-04-05 DIAGNOSIS — Z9181 History of falling: Secondary | ICD-10-CM | POA: Diagnosis not present

## 2022-04-05 DIAGNOSIS — I739 Peripheral vascular disease, unspecified: Secondary | ICD-10-CM | POA: Diagnosis not present

## 2022-04-05 DIAGNOSIS — D509 Iron deficiency anemia, unspecified: Secondary | ICD-10-CM | POA: Diagnosis not present

## 2022-04-05 DIAGNOSIS — D631 Anemia in chronic kidney disease: Secondary | ICD-10-CM | POA: Diagnosis not present

## 2022-04-05 DIAGNOSIS — I10 Essential (primary) hypertension: Secondary | ICD-10-CM | POA: Diagnosis not present

## 2022-04-05 DIAGNOSIS — E876 Hypokalemia: Secondary | ICD-10-CM | POA: Diagnosis not present

## 2022-04-05 DIAGNOSIS — E559 Vitamin D deficiency, unspecified: Secondary | ICD-10-CM | POA: Diagnosis not present

## 2022-04-05 DIAGNOSIS — Z993 Dependence on wheelchair: Secondary | ICD-10-CM | POA: Diagnosis not present

## 2022-04-05 DIAGNOSIS — M24541 Contracture, right hand: Secondary | ICD-10-CM | POA: Diagnosis not present

## 2022-04-05 DIAGNOSIS — G301 Alzheimer's disease with late onset: Secondary | ICD-10-CM | POA: Diagnosis not present

## 2022-04-05 DIAGNOSIS — E441 Mild protein-calorie malnutrition: Secondary | ICD-10-CM | POA: Diagnosis not present

## 2022-04-05 DIAGNOSIS — I129 Hypertensive chronic kidney disease with stage 1 through stage 4 chronic kidney disease, or unspecified chronic kidney disease: Secondary | ICD-10-CM | POA: Diagnosis not present

## 2022-04-05 DIAGNOSIS — E785 Hyperlipidemia, unspecified: Secondary | ICD-10-CM | POA: Diagnosis not present

## 2022-04-05 DIAGNOSIS — M24521 Contracture, right elbow: Secondary | ICD-10-CM | POA: Diagnosis not present

## 2022-04-05 DIAGNOSIS — K219 Gastro-esophageal reflux disease without esophagitis: Secondary | ICD-10-CM | POA: Diagnosis not present

## 2022-04-07 DIAGNOSIS — I10 Essential (primary) hypertension: Secondary | ICD-10-CM | POA: Diagnosis not present

## 2022-04-07 DIAGNOSIS — M24541 Contracture, right hand: Secondary | ICD-10-CM | POA: Diagnosis not present

## 2022-04-07 DIAGNOSIS — E559 Vitamin D deficiency, unspecified: Secondary | ICD-10-CM | POA: Diagnosis not present

## 2022-04-07 DIAGNOSIS — K219 Gastro-esophageal reflux disease without esophagitis: Secondary | ICD-10-CM | POA: Diagnosis not present

## 2022-04-07 DIAGNOSIS — I739 Peripheral vascular disease, unspecified: Secondary | ICD-10-CM | POA: Diagnosis not present

## 2022-04-07 DIAGNOSIS — G301 Alzheimer's disease with late onset: Secondary | ICD-10-CM | POA: Diagnosis not present

## 2022-04-07 DIAGNOSIS — M24521 Contracture, right elbow: Secondary | ICD-10-CM | POA: Diagnosis not present

## 2022-04-07 DIAGNOSIS — Z9181 History of falling: Secondary | ICD-10-CM | POA: Diagnosis not present

## 2022-04-07 DIAGNOSIS — N1831 Chronic kidney disease, stage 3a: Secondary | ICD-10-CM | POA: Diagnosis not present

## 2022-04-07 DIAGNOSIS — D631 Anemia in chronic kidney disease: Secondary | ICD-10-CM | POA: Diagnosis not present

## 2022-04-07 DIAGNOSIS — Z993 Dependence on wheelchair: Secondary | ICD-10-CM | POA: Diagnosis not present

## 2022-04-07 DIAGNOSIS — I129 Hypertensive chronic kidney disease with stage 1 through stage 4 chronic kidney disease, or unspecified chronic kidney disease: Secondary | ICD-10-CM | POA: Diagnosis not present

## 2022-04-07 DIAGNOSIS — E876 Hypokalemia: Secondary | ICD-10-CM | POA: Diagnosis not present

## 2022-04-07 DIAGNOSIS — D509 Iron deficiency anemia, unspecified: Secondary | ICD-10-CM | POA: Diagnosis not present

## 2022-04-07 DIAGNOSIS — E785 Hyperlipidemia, unspecified: Secondary | ICD-10-CM | POA: Diagnosis not present

## 2022-04-07 DIAGNOSIS — E441 Mild protein-calorie malnutrition: Secondary | ICD-10-CM | POA: Diagnosis not present

## 2022-04-07 DIAGNOSIS — F02C0 Dementia in other diseases classified elsewhere, severe, without behavioral disturbance, psychotic disturbance, mood disturbance, and anxiety: Secondary | ICD-10-CM | POA: Diagnosis not present

## 2022-04-13 DIAGNOSIS — D631 Anemia in chronic kidney disease: Secondary | ICD-10-CM | POA: Diagnosis not present

## 2022-04-13 DIAGNOSIS — Z9181 History of falling: Secondary | ICD-10-CM | POA: Diagnosis not present

## 2022-04-13 DIAGNOSIS — M24541 Contracture, right hand: Secondary | ICD-10-CM | POA: Diagnosis not present

## 2022-04-13 DIAGNOSIS — G301 Alzheimer's disease with late onset: Secondary | ICD-10-CM | POA: Diagnosis not present

## 2022-04-13 DIAGNOSIS — I739 Peripheral vascular disease, unspecified: Secondary | ICD-10-CM | POA: Diagnosis not present

## 2022-04-13 DIAGNOSIS — I129 Hypertensive chronic kidney disease with stage 1 through stage 4 chronic kidney disease, or unspecified chronic kidney disease: Secondary | ICD-10-CM | POA: Diagnosis not present

## 2022-04-13 DIAGNOSIS — I10 Essential (primary) hypertension: Secondary | ICD-10-CM | POA: Diagnosis not present

## 2022-04-13 DIAGNOSIS — F02C0 Dementia in other diseases classified elsewhere, severe, without behavioral disturbance, psychotic disturbance, mood disturbance, and anxiety: Secondary | ICD-10-CM | POA: Diagnosis not present

## 2022-04-13 DIAGNOSIS — E785 Hyperlipidemia, unspecified: Secondary | ICD-10-CM | POA: Diagnosis not present

## 2022-04-13 DIAGNOSIS — Z993 Dependence on wheelchair: Secondary | ICD-10-CM | POA: Diagnosis not present

## 2022-04-13 DIAGNOSIS — D509 Iron deficiency anemia, unspecified: Secondary | ICD-10-CM | POA: Diagnosis not present

## 2022-04-13 DIAGNOSIS — K219 Gastro-esophageal reflux disease without esophagitis: Secondary | ICD-10-CM | POA: Diagnosis not present

## 2022-04-13 DIAGNOSIS — E876 Hypokalemia: Secondary | ICD-10-CM | POA: Diagnosis not present

## 2022-04-13 DIAGNOSIS — M24521 Contracture, right elbow: Secondary | ICD-10-CM | POA: Diagnosis not present

## 2022-04-13 DIAGNOSIS — E559 Vitamin D deficiency, unspecified: Secondary | ICD-10-CM | POA: Diagnosis not present

## 2022-04-13 DIAGNOSIS — E441 Mild protein-calorie malnutrition: Secondary | ICD-10-CM | POA: Diagnosis not present

## 2022-04-13 DIAGNOSIS — N1831 Chronic kidney disease, stage 3a: Secondary | ICD-10-CM | POA: Diagnosis not present

## 2022-04-14 DIAGNOSIS — N1831 Chronic kidney disease, stage 3a: Secondary | ICD-10-CM | POA: Diagnosis not present

## 2022-04-14 DIAGNOSIS — Z993 Dependence on wheelchair: Secondary | ICD-10-CM | POA: Diagnosis not present

## 2022-04-14 DIAGNOSIS — E876 Hypokalemia: Secondary | ICD-10-CM | POA: Diagnosis not present

## 2022-04-14 DIAGNOSIS — E441 Mild protein-calorie malnutrition: Secondary | ICD-10-CM | POA: Diagnosis not present

## 2022-04-14 DIAGNOSIS — K219 Gastro-esophageal reflux disease without esophagitis: Secondary | ICD-10-CM | POA: Diagnosis not present

## 2022-04-14 DIAGNOSIS — E559 Vitamin D deficiency, unspecified: Secondary | ICD-10-CM | POA: Diagnosis not present

## 2022-04-14 DIAGNOSIS — I739 Peripheral vascular disease, unspecified: Secondary | ICD-10-CM | POA: Diagnosis not present

## 2022-04-14 DIAGNOSIS — M24521 Contracture, right elbow: Secondary | ICD-10-CM | POA: Diagnosis not present

## 2022-04-14 DIAGNOSIS — Z9181 History of falling: Secondary | ICD-10-CM | POA: Diagnosis not present

## 2022-04-14 DIAGNOSIS — F02C0 Dementia in other diseases classified elsewhere, severe, without behavioral disturbance, psychotic disturbance, mood disturbance, and anxiety: Secondary | ICD-10-CM | POA: Diagnosis not present

## 2022-04-14 DIAGNOSIS — I10 Essential (primary) hypertension: Secondary | ICD-10-CM | POA: Diagnosis not present

## 2022-04-14 DIAGNOSIS — D509 Iron deficiency anemia, unspecified: Secondary | ICD-10-CM | POA: Diagnosis not present

## 2022-04-14 DIAGNOSIS — I129 Hypertensive chronic kidney disease with stage 1 through stage 4 chronic kidney disease, or unspecified chronic kidney disease: Secondary | ICD-10-CM | POA: Diagnosis not present

## 2022-04-14 DIAGNOSIS — D631 Anemia in chronic kidney disease: Secondary | ICD-10-CM | POA: Diagnosis not present

## 2022-04-14 DIAGNOSIS — G301 Alzheimer's disease with late onset: Secondary | ICD-10-CM | POA: Diagnosis not present

## 2022-04-14 DIAGNOSIS — M24541 Contracture, right hand: Secondary | ICD-10-CM | POA: Diagnosis not present

## 2022-04-14 DIAGNOSIS — E785 Hyperlipidemia, unspecified: Secondary | ICD-10-CM | POA: Diagnosis not present

## 2022-04-19 DIAGNOSIS — N1831 Chronic kidney disease, stage 3a: Secondary | ICD-10-CM | POA: Diagnosis not present

## 2022-04-19 DIAGNOSIS — E785 Hyperlipidemia, unspecified: Secondary | ICD-10-CM | POA: Diagnosis not present

## 2022-04-19 DIAGNOSIS — E876 Hypokalemia: Secondary | ICD-10-CM | POA: Diagnosis not present

## 2022-04-19 DIAGNOSIS — D631 Anemia in chronic kidney disease: Secondary | ICD-10-CM | POA: Diagnosis not present

## 2022-04-19 DIAGNOSIS — D509 Iron deficiency anemia, unspecified: Secondary | ICD-10-CM | POA: Diagnosis not present

## 2022-04-19 DIAGNOSIS — F02C0 Dementia in other diseases classified elsewhere, severe, without behavioral disturbance, psychotic disturbance, mood disturbance, and anxiety: Secondary | ICD-10-CM | POA: Diagnosis not present

## 2022-04-19 DIAGNOSIS — K219 Gastro-esophageal reflux disease without esophagitis: Secondary | ICD-10-CM | POA: Diagnosis not present

## 2022-04-19 DIAGNOSIS — G301 Alzheimer's disease with late onset: Secondary | ICD-10-CM | POA: Diagnosis not present

## 2022-04-19 DIAGNOSIS — M24541 Contracture, right hand: Secondary | ICD-10-CM | POA: Diagnosis not present

## 2022-04-19 DIAGNOSIS — I129 Hypertensive chronic kidney disease with stage 1 through stage 4 chronic kidney disease, or unspecified chronic kidney disease: Secondary | ICD-10-CM | POA: Diagnosis not present

## 2022-04-19 DIAGNOSIS — Z9181 History of falling: Secondary | ICD-10-CM | POA: Diagnosis not present

## 2022-04-19 DIAGNOSIS — I739 Peripheral vascular disease, unspecified: Secondary | ICD-10-CM | POA: Diagnosis not present

## 2022-04-19 DIAGNOSIS — E441 Mild protein-calorie malnutrition: Secondary | ICD-10-CM | POA: Diagnosis not present

## 2022-04-19 DIAGNOSIS — E559 Vitamin D deficiency, unspecified: Secondary | ICD-10-CM | POA: Diagnosis not present

## 2022-04-19 DIAGNOSIS — I10 Essential (primary) hypertension: Secondary | ICD-10-CM | POA: Diagnosis not present

## 2022-04-19 DIAGNOSIS — M24521 Contracture, right elbow: Secondary | ICD-10-CM | POA: Diagnosis not present

## 2022-04-19 DIAGNOSIS — Z993 Dependence on wheelchair: Secondary | ICD-10-CM | POA: Diagnosis not present

## 2022-04-20 DIAGNOSIS — I129 Hypertensive chronic kidney disease with stage 1 through stage 4 chronic kidney disease, or unspecified chronic kidney disease: Secondary | ICD-10-CM | POA: Diagnosis not present

## 2022-04-20 DIAGNOSIS — Z993 Dependence on wheelchair: Secondary | ICD-10-CM | POA: Diagnosis not present

## 2022-04-20 DIAGNOSIS — F02C0 Dementia in other diseases classified elsewhere, severe, without behavioral disturbance, psychotic disturbance, mood disturbance, and anxiety: Secondary | ICD-10-CM | POA: Diagnosis not present

## 2022-04-20 DIAGNOSIS — G301 Alzheimer's disease with late onset: Secondary | ICD-10-CM | POA: Diagnosis not present

## 2022-04-20 DIAGNOSIS — K219 Gastro-esophageal reflux disease without esophagitis: Secondary | ICD-10-CM | POA: Diagnosis not present

## 2022-04-20 DIAGNOSIS — N1831 Chronic kidney disease, stage 3a: Secondary | ICD-10-CM | POA: Diagnosis not present

## 2022-04-20 DIAGNOSIS — D509 Iron deficiency anemia, unspecified: Secondary | ICD-10-CM | POA: Diagnosis not present

## 2022-04-21 DIAGNOSIS — K219 Gastro-esophageal reflux disease without esophagitis: Secondary | ICD-10-CM | POA: Diagnosis not present

## 2022-04-21 DIAGNOSIS — E559 Vitamin D deficiency, unspecified: Secondary | ICD-10-CM | POA: Diagnosis not present

## 2022-04-21 DIAGNOSIS — F02C0 Dementia in other diseases classified elsewhere, severe, without behavioral disturbance, psychotic disturbance, mood disturbance, and anxiety: Secondary | ICD-10-CM | POA: Diagnosis not present

## 2022-04-21 DIAGNOSIS — E785 Hyperlipidemia, unspecified: Secondary | ICD-10-CM | POA: Diagnosis not present

## 2022-04-21 DIAGNOSIS — Z9181 History of falling: Secondary | ICD-10-CM | POA: Diagnosis not present

## 2022-04-21 DIAGNOSIS — M24521 Contracture, right elbow: Secondary | ICD-10-CM | POA: Diagnosis not present

## 2022-04-21 DIAGNOSIS — D631 Anemia in chronic kidney disease: Secondary | ICD-10-CM | POA: Diagnosis not present

## 2022-04-21 DIAGNOSIS — G301 Alzheimer's disease with late onset: Secondary | ICD-10-CM | POA: Diagnosis not present

## 2022-04-21 DIAGNOSIS — Z993 Dependence on wheelchair: Secondary | ICD-10-CM | POA: Diagnosis not present

## 2022-04-21 DIAGNOSIS — N1831 Chronic kidney disease, stage 3a: Secondary | ICD-10-CM | POA: Diagnosis not present

## 2022-04-21 DIAGNOSIS — E876 Hypokalemia: Secondary | ICD-10-CM | POA: Diagnosis not present

## 2022-04-21 DIAGNOSIS — D509 Iron deficiency anemia, unspecified: Secondary | ICD-10-CM | POA: Diagnosis not present

## 2022-04-21 DIAGNOSIS — E441 Mild protein-calorie malnutrition: Secondary | ICD-10-CM | POA: Diagnosis not present

## 2022-04-21 DIAGNOSIS — M24541 Contracture, right hand: Secondary | ICD-10-CM | POA: Diagnosis not present

## 2022-04-21 DIAGNOSIS — I129 Hypertensive chronic kidney disease with stage 1 through stage 4 chronic kidney disease, or unspecified chronic kidney disease: Secondary | ICD-10-CM | POA: Diagnosis not present

## 2022-04-21 DIAGNOSIS — I10 Essential (primary) hypertension: Secondary | ICD-10-CM | POA: Diagnosis not present

## 2022-04-21 DIAGNOSIS — I739 Peripheral vascular disease, unspecified: Secondary | ICD-10-CM | POA: Diagnosis not present

## 2022-04-22 DIAGNOSIS — Z79899 Other long term (current) drug therapy: Secondary | ICD-10-CM | POA: Diagnosis not present

## 2022-04-22 DIAGNOSIS — E559 Vitamin D deficiency, unspecified: Secondary | ICD-10-CM | POA: Diagnosis not present

## 2022-04-26 DIAGNOSIS — D509 Iron deficiency anemia, unspecified: Secondary | ICD-10-CM | POA: Diagnosis not present

## 2022-04-26 DIAGNOSIS — E876 Hypokalemia: Secondary | ICD-10-CM | POA: Diagnosis not present

## 2022-04-26 DIAGNOSIS — K219 Gastro-esophageal reflux disease without esophagitis: Secondary | ICD-10-CM | POA: Diagnosis not present

## 2022-04-26 DIAGNOSIS — F02C0 Dementia in other diseases classified elsewhere, severe, without behavioral disturbance, psychotic disturbance, mood disturbance, and anxiety: Secondary | ICD-10-CM | POA: Diagnosis not present

## 2022-04-26 DIAGNOSIS — E441 Mild protein-calorie malnutrition: Secondary | ICD-10-CM | POA: Diagnosis not present

## 2022-04-26 DIAGNOSIS — N1831 Chronic kidney disease, stage 3a: Secondary | ICD-10-CM | POA: Diagnosis not present

## 2022-04-26 DIAGNOSIS — G301 Alzheimer's disease with late onset: Secondary | ICD-10-CM | POA: Diagnosis not present

## 2022-04-26 DIAGNOSIS — M24541 Contracture, right hand: Secondary | ICD-10-CM | POA: Diagnosis not present

## 2022-04-26 DIAGNOSIS — D631 Anemia in chronic kidney disease: Secondary | ICD-10-CM | POA: Diagnosis not present

## 2022-04-26 DIAGNOSIS — I739 Peripheral vascular disease, unspecified: Secondary | ICD-10-CM | POA: Diagnosis not present

## 2022-04-26 DIAGNOSIS — I129 Hypertensive chronic kidney disease with stage 1 through stage 4 chronic kidney disease, or unspecified chronic kidney disease: Secondary | ICD-10-CM | POA: Diagnosis not present

## 2022-04-26 DIAGNOSIS — I10 Essential (primary) hypertension: Secondary | ICD-10-CM | POA: Diagnosis not present

## 2022-04-26 DIAGNOSIS — Z993 Dependence on wheelchair: Secondary | ICD-10-CM | POA: Diagnosis not present

## 2022-04-26 DIAGNOSIS — E785 Hyperlipidemia, unspecified: Secondary | ICD-10-CM | POA: Diagnosis not present

## 2022-04-26 DIAGNOSIS — E559 Vitamin D deficiency, unspecified: Secondary | ICD-10-CM | POA: Diagnosis not present

## 2022-04-26 DIAGNOSIS — Z9181 History of falling: Secondary | ICD-10-CM | POA: Diagnosis not present

## 2022-04-26 DIAGNOSIS — M24521 Contracture, right elbow: Secondary | ICD-10-CM | POA: Diagnosis not present

## 2022-04-27 DIAGNOSIS — E876 Hypokalemia: Secondary | ICD-10-CM | POA: Diagnosis not present

## 2022-04-27 DIAGNOSIS — N1831 Chronic kidney disease, stage 3a: Secondary | ICD-10-CM | POA: Diagnosis not present

## 2022-04-27 DIAGNOSIS — D509 Iron deficiency anemia, unspecified: Secondary | ICD-10-CM | POA: Diagnosis not present

## 2022-04-27 DIAGNOSIS — Z993 Dependence on wheelchair: Secondary | ICD-10-CM | POA: Diagnosis not present

## 2022-04-27 DIAGNOSIS — M24541 Contracture, right hand: Secondary | ICD-10-CM | POA: Diagnosis not present

## 2022-04-27 DIAGNOSIS — F02C0 Dementia in other diseases classified elsewhere, severe, without behavioral disturbance, psychotic disturbance, mood disturbance, and anxiety: Secondary | ICD-10-CM | POA: Diagnosis not present

## 2022-04-27 DIAGNOSIS — Z9181 History of falling: Secondary | ICD-10-CM | POA: Diagnosis not present

## 2022-04-27 DIAGNOSIS — K219 Gastro-esophageal reflux disease without esophagitis: Secondary | ICD-10-CM | POA: Diagnosis not present

## 2022-04-27 DIAGNOSIS — I10 Essential (primary) hypertension: Secondary | ICD-10-CM | POA: Diagnosis not present

## 2022-04-27 DIAGNOSIS — E785 Hyperlipidemia, unspecified: Secondary | ICD-10-CM | POA: Diagnosis not present

## 2022-04-27 DIAGNOSIS — I739 Peripheral vascular disease, unspecified: Secondary | ICD-10-CM | POA: Diagnosis not present

## 2022-04-27 DIAGNOSIS — I129 Hypertensive chronic kidney disease with stage 1 through stage 4 chronic kidney disease, or unspecified chronic kidney disease: Secondary | ICD-10-CM | POA: Diagnosis not present

## 2022-04-27 DIAGNOSIS — D631 Anemia in chronic kidney disease: Secondary | ICD-10-CM | POA: Diagnosis not present

## 2022-04-27 DIAGNOSIS — G301 Alzheimer's disease with late onset: Secondary | ICD-10-CM | POA: Diagnosis not present

## 2022-04-27 DIAGNOSIS — M24521 Contracture, right elbow: Secondary | ICD-10-CM | POA: Diagnosis not present

## 2022-04-27 DIAGNOSIS — E559 Vitamin D deficiency, unspecified: Secondary | ICD-10-CM | POA: Diagnosis not present

## 2022-04-27 DIAGNOSIS — E441 Mild protein-calorie malnutrition: Secondary | ICD-10-CM | POA: Diagnosis not present

## 2022-05-05 DIAGNOSIS — M24521 Contracture, right elbow: Secondary | ICD-10-CM | POA: Diagnosis not present

## 2022-05-05 DIAGNOSIS — E559 Vitamin D deficiency, unspecified: Secondary | ICD-10-CM | POA: Diagnosis not present

## 2022-05-05 DIAGNOSIS — E785 Hyperlipidemia, unspecified: Secondary | ICD-10-CM | POA: Diagnosis not present

## 2022-05-05 DIAGNOSIS — I10 Essential (primary) hypertension: Secondary | ICD-10-CM | POA: Diagnosis not present

## 2022-05-05 DIAGNOSIS — D509 Iron deficiency anemia, unspecified: Secondary | ICD-10-CM | POA: Diagnosis not present

## 2022-05-05 DIAGNOSIS — F02C0 Dementia in other diseases classified elsewhere, severe, without behavioral disturbance, psychotic disturbance, mood disturbance, and anxiety: Secondary | ICD-10-CM | POA: Diagnosis not present

## 2022-05-05 DIAGNOSIS — M24541 Contracture, right hand: Secondary | ICD-10-CM | POA: Diagnosis not present

## 2022-05-05 DIAGNOSIS — D631 Anemia in chronic kidney disease: Secondary | ICD-10-CM | POA: Diagnosis not present

## 2022-05-05 DIAGNOSIS — N1831 Chronic kidney disease, stage 3a: Secondary | ICD-10-CM | POA: Diagnosis not present

## 2022-05-05 DIAGNOSIS — I129 Hypertensive chronic kidney disease with stage 1 through stage 4 chronic kidney disease, or unspecified chronic kidney disease: Secondary | ICD-10-CM | POA: Diagnosis not present

## 2022-05-05 DIAGNOSIS — E441 Mild protein-calorie malnutrition: Secondary | ICD-10-CM | POA: Diagnosis not present

## 2022-05-05 DIAGNOSIS — E876 Hypokalemia: Secondary | ICD-10-CM | POA: Diagnosis not present

## 2022-05-05 DIAGNOSIS — Z993 Dependence on wheelchair: Secondary | ICD-10-CM | POA: Diagnosis not present

## 2022-05-05 DIAGNOSIS — G301 Alzheimer's disease with late onset: Secondary | ICD-10-CM | POA: Diagnosis not present

## 2022-05-05 DIAGNOSIS — Z9181 History of falling: Secondary | ICD-10-CM | POA: Diagnosis not present

## 2022-05-05 DIAGNOSIS — I739 Peripheral vascular disease, unspecified: Secondary | ICD-10-CM | POA: Diagnosis not present

## 2022-05-05 DIAGNOSIS — K219 Gastro-esophageal reflux disease without esophagitis: Secondary | ICD-10-CM | POA: Diagnosis not present

## 2022-05-11 DIAGNOSIS — Z993 Dependence on wheelchair: Secondary | ICD-10-CM | POA: Diagnosis not present

## 2022-05-11 DIAGNOSIS — I10 Essential (primary) hypertension: Secondary | ICD-10-CM | POA: Diagnosis not present

## 2022-05-11 DIAGNOSIS — E785 Hyperlipidemia, unspecified: Secondary | ICD-10-CM | POA: Diagnosis not present

## 2022-05-11 DIAGNOSIS — Z9181 History of falling: Secondary | ICD-10-CM | POA: Diagnosis not present

## 2022-05-11 DIAGNOSIS — M24521 Contracture, right elbow: Secondary | ICD-10-CM | POA: Diagnosis not present

## 2022-05-11 DIAGNOSIS — G301 Alzheimer's disease with late onset: Secondary | ICD-10-CM | POA: Diagnosis not present

## 2022-05-11 DIAGNOSIS — E559 Vitamin D deficiency, unspecified: Secondary | ICD-10-CM | POA: Diagnosis not present

## 2022-05-11 DIAGNOSIS — F02C0 Dementia in other diseases classified elsewhere, severe, without behavioral disturbance, psychotic disturbance, mood disturbance, and anxiety: Secondary | ICD-10-CM | POA: Diagnosis not present

## 2022-05-11 DIAGNOSIS — I739 Peripheral vascular disease, unspecified: Secondary | ICD-10-CM | POA: Diagnosis not present

## 2022-05-11 DIAGNOSIS — D509 Iron deficiency anemia, unspecified: Secondary | ICD-10-CM | POA: Diagnosis not present

## 2022-05-11 DIAGNOSIS — M24541 Contracture, right hand: Secondary | ICD-10-CM | POA: Diagnosis not present

## 2022-05-11 DIAGNOSIS — K219 Gastro-esophageal reflux disease without esophagitis: Secondary | ICD-10-CM | POA: Diagnosis not present

## 2022-05-11 DIAGNOSIS — E876 Hypokalemia: Secondary | ICD-10-CM | POA: Diagnosis not present

## 2022-05-11 DIAGNOSIS — I129 Hypertensive chronic kidney disease with stage 1 through stage 4 chronic kidney disease, or unspecified chronic kidney disease: Secondary | ICD-10-CM | POA: Diagnosis not present

## 2022-05-11 DIAGNOSIS — E441 Mild protein-calorie malnutrition: Secondary | ICD-10-CM | POA: Diagnosis not present

## 2022-05-11 DIAGNOSIS — D631 Anemia in chronic kidney disease: Secondary | ICD-10-CM | POA: Diagnosis not present

## 2022-05-11 DIAGNOSIS — N1831 Chronic kidney disease, stage 3a: Secondary | ICD-10-CM | POA: Diagnosis not present

## 2022-05-12 DIAGNOSIS — I129 Hypertensive chronic kidney disease with stage 1 through stage 4 chronic kidney disease, or unspecified chronic kidney disease: Secondary | ICD-10-CM | POA: Diagnosis not present

## 2022-05-12 DIAGNOSIS — N1831 Chronic kidney disease, stage 3a: Secondary | ICD-10-CM | POA: Diagnosis not present

## 2022-05-12 DIAGNOSIS — E559 Vitamin D deficiency, unspecified: Secondary | ICD-10-CM | POA: Diagnosis not present

## 2022-05-12 DIAGNOSIS — E876 Hypokalemia: Secondary | ICD-10-CM | POA: Diagnosis not present

## 2022-05-12 DIAGNOSIS — E785 Hyperlipidemia, unspecified: Secondary | ICD-10-CM | POA: Diagnosis not present

## 2022-05-12 DIAGNOSIS — G301 Alzheimer's disease with late onset: Secondary | ICD-10-CM | POA: Diagnosis not present

## 2022-05-12 DIAGNOSIS — I739 Peripheral vascular disease, unspecified: Secondary | ICD-10-CM | POA: Diagnosis not present

## 2022-05-12 DIAGNOSIS — D509 Iron deficiency anemia, unspecified: Secondary | ICD-10-CM | POA: Diagnosis not present

## 2022-05-12 DIAGNOSIS — M24521 Contracture, right elbow: Secondary | ICD-10-CM | POA: Diagnosis not present

## 2022-05-12 DIAGNOSIS — Z9181 History of falling: Secondary | ICD-10-CM | POA: Diagnosis not present

## 2022-05-12 DIAGNOSIS — K219 Gastro-esophageal reflux disease without esophagitis: Secondary | ICD-10-CM | POA: Diagnosis not present

## 2022-05-12 DIAGNOSIS — F02C0 Dementia in other diseases classified elsewhere, severe, without behavioral disturbance, psychotic disturbance, mood disturbance, and anxiety: Secondary | ICD-10-CM | POA: Diagnosis not present

## 2022-05-12 DIAGNOSIS — I10 Essential (primary) hypertension: Secondary | ICD-10-CM | POA: Diagnosis not present

## 2022-05-12 DIAGNOSIS — E441 Mild protein-calorie malnutrition: Secondary | ICD-10-CM | POA: Diagnosis not present

## 2022-05-12 DIAGNOSIS — Z993 Dependence on wheelchair: Secondary | ICD-10-CM | POA: Diagnosis not present

## 2022-05-12 DIAGNOSIS — D631 Anemia in chronic kidney disease: Secondary | ICD-10-CM | POA: Diagnosis not present

## 2022-05-12 DIAGNOSIS — M24541 Contracture, right hand: Secondary | ICD-10-CM | POA: Diagnosis not present

## 2022-05-18 DIAGNOSIS — D631 Anemia in chronic kidney disease: Secondary | ICD-10-CM | POA: Diagnosis not present

## 2022-05-18 DIAGNOSIS — I129 Hypertensive chronic kidney disease with stage 1 through stage 4 chronic kidney disease, or unspecified chronic kidney disease: Secondary | ICD-10-CM | POA: Diagnosis not present

## 2022-05-18 DIAGNOSIS — D509 Iron deficiency anemia, unspecified: Secondary | ICD-10-CM | POA: Diagnosis not present

## 2022-05-18 DIAGNOSIS — M24541 Contracture, right hand: Secondary | ICD-10-CM | POA: Diagnosis not present

## 2022-05-18 DIAGNOSIS — E785 Hyperlipidemia, unspecified: Secondary | ICD-10-CM | POA: Diagnosis not present

## 2022-05-18 DIAGNOSIS — I10 Essential (primary) hypertension: Secondary | ICD-10-CM | POA: Diagnosis not present

## 2022-05-18 DIAGNOSIS — I739 Peripheral vascular disease, unspecified: Secondary | ICD-10-CM | POA: Diagnosis not present

## 2022-05-18 DIAGNOSIS — G301 Alzheimer's disease with late onset: Secondary | ICD-10-CM | POA: Diagnosis not present

## 2022-05-18 DIAGNOSIS — E559 Vitamin D deficiency, unspecified: Secondary | ICD-10-CM | POA: Diagnosis not present

## 2022-05-18 DIAGNOSIS — E876 Hypokalemia: Secondary | ICD-10-CM | POA: Diagnosis not present

## 2022-05-18 DIAGNOSIS — N1831 Chronic kidney disease, stage 3a: Secondary | ICD-10-CM | POA: Diagnosis not present

## 2022-05-18 DIAGNOSIS — K219 Gastro-esophageal reflux disease without esophagitis: Secondary | ICD-10-CM | POA: Diagnosis not present

## 2022-05-18 DIAGNOSIS — M24521 Contracture, right elbow: Secondary | ICD-10-CM | POA: Diagnosis not present

## 2022-05-18 DIAGNOSIS — Z9181 History of falling: Secondary | ICD-10-CM | POA: Diagnosis not present

## 2022-05-18 DIAGNOSIS — Z993 Dependence on wheelchair: Secondary | ICD-10-CM | POA: Diagnosis not present

## 2022-05-18 DIAGNOSIS — F02C0 Dementia in other diseases classified elsewhere, severe, without behavioral disturbance, psychotic disturbance, mood disturbance, and anxiety: Secondary | ICD-10-CM | POA: Diagnosis not present

## 2022-05-18 DIAGNOSIS — E441 Mild protein-calorie malnutrition: Secondary | ICD-10-CM | POA: Diagnosis not present

## 2022-05-18 DIAGNOSIS — R269 Unspecified abnormalities of gait and mobility: Secondary | ICD-10-CM | POA: Diagnosis not present

## 2022-05-21 DIAGNOSIS — G309 Alzheimer's disease, unspecified: Secondary | ICD-10-CM | POA: Diagnosis not present

## 2022-05-26 DIAGNOSIS — E441 Mild protein-calorie malnutrition: Secondary | ICD-10-CM | POA: Diagnosis not present

## 2022-05-26 DIAGNOSIS — D509 Iron deficiency anemia, unspecified: Secondary | ICD-10-CM | POA: Diagnosis not present

## 2022-05-26 DIAGNOSIS — E559 Vitamin D deficiency, unspecified: Secondary | ICD-10-CM | POA: Diagnosis not present

## 2022-05-26 DIAGNOSIS — D631 Anemia in chronic kidney disease: Secondary | ICD-10-CM | POA: Diagnosis not present

## 2022-05-26 DIAGNOSIS — E876 Hypokalemia: Secondary | ICD-10-CM | POA: Diagnosis not present

## 2022-05-26 DIAGNOSIS — Z9181 History of falling: Secondary | ICD-10-CM | POA: Diagnosis not present

## 2022-05-26 DIAGNOSIS — N1831 Chronic kidney disease, stage 3a: Secondary | ICD-10-CM | POA: Diagnosis not present

## 2022-05-26 DIAGNOSIS — M24521 Contracture, right elbow: Secondary | ICD-10-CM | POA: Diagnosis not present

## 2022-05-26 DIAGNOSIS — I739 Peripheral vascular disease, unspecified: Secondary | ICD-10-CM | POA: Diagnosis not present

## 2022-05-26 DIAGNOSIS — M24541 Contracture, right hand: Secondary | ICD-10-CM | POA: Diagnosis not present

## 2022-05-26 DIAGNOSIS — K219 Gastro-esophageal reflux disease without esophagitis: Secondary | ICD-10-CM | POA: Diagnosis not present

## 2022-05-26 DIAGNOSIS — G301 Alzheimer's disease with late onset: Secondary | ICD-10-CM | POA: Diagnosis not present

## 2022-05-26 DIAGNOSIS — I129 Hypertensive chronic kidney disease with stage 1 through stage 4 chronic kidney disease, or unspecified chronic kidney disease: Secondary | ICD-10-CM | POA: Diagnosis not present

## 2022-05-26 DIAGNOSIS — F02C0 Dementia in other diseases classified elsewhere, severe, without behavioral disturbance, psychotic disturbance, mood disturbance, and anxiety: Secondary | ICD-10-CM | POA: Diagnosis not present

## 2022-05-26 DIAGNOSIS — E785 Hyperlipidemia, unspecified: Secondary | ICD-10-CM | POA: Diagnosis not present

## 2022-05-26 DIAGNOSIS — Z993 Dependence on wheelchair: Secondary | ICD-10-CM | POA: Diagnosis not present

## 2022-05-26 DIAGNOSIS — I10 Essential (primary) hypertension: Secondary | ICD-10-CM | POA: Diagnosis not present

## 2022-05-27 DIAGNOSIS — G301 Alzheimer's disease with late onset: Secondary | ICD-10-CM | POA: Diagnosis not present

## 2022-05-28 DIAGNOSIS — E441 Mild protein-calorie malnutrition: Secondary | ICD-10-CM | POA: Diagnosis not present

## 2022-05-28 DIAGNOSIS — K219 Gastro-esophageal reflux disease without esophagitis: Secondary | ICD-10-CM | POA: Diagnosis not present

## 2022-05-28 DIAGNOSIS — N1831 Chronic kidney disease, stage 3a: Secondary | ICD-10-CM | POA: Diagnosis not present

## 2022-05-28 DIAGNOSIS — D631 Anemia in chronic kidney disease: Secondary | ICD-10-CM | POA: Diagnosis not present

## 2022-05-28 DIAGNOSIS — D509 Iron deficiency anemia, unspecified: Secondary | ICD-10-CM | POA: Diagnosis not present

## 2022-05-28 DIAGNOSIS — F02C0 Dementia in other diseases classified elsewhere, severe, without behavioral disturbance, psychotic disturbance, mood disturbance, and anxiety: Secondary | ICD-10-CM | POA: Diagnosis not present

## 2022-05-28 DIAGNOSIS — E559 Vitamin D deficiency, unspecified: Secondary | ICD-10-CM | POA: Diagnosis not present

## 2022-05-28 DIAGNOSIS — I129 Hypertensive chronic kidney disease with stage 1 through stage 4 chronic kidney disease, or unspecified chronic kidney disease: Secondary | ICD-10-CM | POA: Diagnosis not present

## 2022-05-28 DIAGNOSIS — G301 Alzheimer's disease with late onset: Secondary | ICD-10-CM | POA: Diagnosis not present

## 2022-05-28 DIAGNOSIS — E785 Hyperlipidemia, unspecified: Secondary | ICD-10-CM | POA: Diagnosis not present

## 2022-05-28 DIAGNOSIS — I10 Essential (primary) hypertension: Secondary | ICD-10-CM | POA: Diagnosis not present

## 2022-05-28 DIAGNOSIS — M24521 Contracture, right elbow: Secondary | ICD-10-CM | POA: Diagnosis not present

## 2022-05-28 DIAGNOSIS — Z993 Dependence on wheelchair: Secondary | ICD-10-CM | POA: Diagnosis not present

## 2022-05-28 DIAGNOSIS — E876 Hypokalemia: Secondary | ICD-10-CM | POA: Diagnosis not present

## 2022-05-28 DIAGNOSIS — Z9181 History of falling: Secondary | ICD-10-CM | POA: Diagnosis not present

## 2022-05-28 DIAGNOSIS — I739 Peripheral vascular disease, unspecified: Secondary | ICD-10-CM | POA: Diagnosis not present

## 2022-05-28 DIAGNOSIS — M24541 Contracture, right hand: Secondary | ICD-10-CM | POA: Diagnosis not present

## 2022-05-31 DIAGNOSIS — D509 Iron deficiency anemia, unspecified: Secondary | ICD-10-CM | POA: Diagnosis not present

## 2022-05-31 DIAGNOSIS — D631 Anemia in chronic kidney disease: Secondary | ICD-10-CM | POA: Diagnosis not present

## 2022-05-31 DIAGNOSIS — F02C0 Dementia in other diseases classified elsewhere, severe, without behavioral disturbance, psychotic disturbance, mood disturbance, and anxiety: Secondary | ICD-10-CM | POA: Diagnosis not present

## 2022-05-31 DIAGNOSIS — Z993 Dependence on wheelchair: Secondary | ICD-10-CM | POA: Diagnosis not present

## 2022-05-31 DIAGNOSIS — G301 Alzheimer's disease with late onset: Secondary | ICD-10-CM | POA: Diagnosis not present

## 2022-05-31 DIAGNOSIS — E441 Mild protein-calorie malnutrition: Secondary | ICD-10-CM | POA: Diagnosis not present

## 2022-05-31 DIAGNOSIS — I739 Peripheral vascular disease, unspecified: Secondary | ICD-10-CM | POA: Diagnosis not present

## 2022-05-31 DIAGNOSIS — E559 Vitamin D deficiency, unspecified: Secondary | ICD-10-CM | POA: Diagnosis not present

## 2022-05-31 DIAGNOSIS — I129 Hypertensive chronic kidney disease with stage 1 through stage 4 chronic kidney disease, or unspecified chronic kidney disease: Secondary | ICD-10-CM | POA: Diagnosis not present

## 2022-05-31 DIAGNOSIS — N1831 Chronic kidney disease, stage 3a: Secondary | ICD-10-CM | POA: Diagnosis not present

## 2022-05-31 DIAGNOSIS — I10 Essential (primary) hypertension: Secondary | ICD-10-CM | POA: Diagnosis not present

## 2022-05-31 DIAGNOSIS — M24521 Contracture, right elbow: Secondary | ICD-10-CM | POA: Diagnosis not present

## 2022-05-31 DIAGNOSIS — M24541 Contracture, right hand: Secondary | ICD-10-CM | POA: Diagnosis not present

## 2022-05-31 DIAGNOSIS — Z9181 History of falling: Secondary | ICD-10-CM | POA: Diagnosis not present

## 2022-05-31 DIAGNOSIS — E785 Hyperlipidemia, unspecified: Secondary | ICD-10-CM | POA: Diagnosis not present

## 2022-05-31 DIAGNOSIS — K219 Gastro-esophageal reflux disease without esophagitis: Secondary | ICD-10-CM | POA: Diagnosis not present

## 2022-05-31 DIAGNOSIS — E876 Hypokalemia: Secondary | ICD-10-CM | POA: Diagnosis not present

## 2022-06-09 DIAGNOSIS — E441 Mild protein-calorie malnutrition: Secondary | ICD-10-CM | POA: Diagnosis not present

## 2022-06-09 DIAGNOSIS — I10 Essential (primary) hypertension: Secondary | ICD-10-CM | POA: Diagnosis not present

## 2022-06-09 DIAGNOSIS — N1831 Chronic kidney disease, stage 3a: Secondary | ICD-10-CM | POA: Diagnosis not present

## 2022-06-09 DIAGNOSIS — M24521 Contracture, right elbow: Secondary | ICD-10-CM | POA: Diagnosis not present

## 2022-06-09 DIAGNOSIS — E876 Hypokalemia: Secondary | ICD-10-CM | POA: Diagnosis not present

## 2022-06-09 DIAGNOSIS — G301 Alzheimer's disease with late onset: Secondary | ICD-10-CM | POA: Diagnosis not present

## 2022-06-09 DIAGNOSIS — I129 Hypertensive chronic kidney disease with stage 1 through stage 4 chronic kidney disease, or unspecified chronic kidney disease: Secondary | ICD-10-CM | POA: Diagnosis not present

## 2022-06-09 DIAGNOSIS — Z993 Dependence on wheelchair: Secondary | ICD-10-CM | POA: Diagnosis not present

## 2022-06-09 DIAGNOSIS — D631 Anemia in chronic kidney disease: Secondary | ICD-10-CM | POA: Diagnosis not present

## 2022-06-09 DIAGNOSIS — I739 Peripheral vascular disease, unspecified: Secondary | ICD-10-CM | POA: Diagnosis not present

## 2022-06-09 DIAGNOSIS — D509 Iron deficiency anemia, unspecified: Secondary | ICD-10-CM | POA: Diagnosis not present

## 2022-06-09 DIAGNOSIS — K219 Gastro-esophageal reflux disease without esophagitis: Secondary | ICD-10-CM | POA: Diagnosis not present

## 2022-06-09 DIAGNOSIS — E785 Hyperlipidemia, unspecified: Secondary | ICD-10-CM | POA: Diagnosis not present

## 2022-06-09 DIAGNOSIS — M24541 Contracture, right hand: Secondary | ICD-10-CM | POA: Diagnosis not present

## 2022-06-09 DIAGNOSIS — E559 Vitamin D deficiency, unspecified: Secondary | ICD-10-CM | POA: Diagnosis not present

## 2022-06-09 DIAGNOSIS — Z9181 History of falling: Secondary | ICD-10-CM | POA: Diagnosis not present

## 2022-06-09 DIAGNOSIS — F02C0 Dementia in other diseases classified elsewhere, severe, without behavioral disturbance, psychotic disturbance, mood disturbance, and anxiety: Secondary | ICD-10-CM | POA: Diagnosis not present

## 2022-06-11 DIAGNOSIS — G301 Alzheimer's disease with late onset: Secondary | ICD-10-CM | POA: Diagnosis not present

## 2022-06-11 DIAGNOSIS — F02C0 Dementia in other diseases classified elsewhere, severe, without behavioral disturbance, psychotic disturbance, mood disturbance, and anxiety: Secondary | ICD-10-CM | POA: Diagnosis not present

## 2022-06-11 DIAGNOSIS — I129 Hypertensive chronic kidney disease with stage 1 through stage 4 chronic kidney disease, or unspecified chronic kidney disease: Secondary | ICD-10-CM | POA: Diagnosis not present

## 2022-06-15 DIAGNOSIS — R269 Unspecified abnormalities of gait and mobility: Secondary | ICD-10-CM | POA: Diagnosis not present

## 2022-06-15 DIAGNOSIS — G301 Alzheimer's disease with late onset: Secondary | ICD-10-CM | POA: Diagnosis not present

## 2022-06-15 DIAGNOSIS — I129 Hypertensive chronic kidney disease with stage 1 through stage 4 chronic kidney disease, or unspecified chronic kidney disease: Secondary | ICD-10-CM | POA: Diagnosis not present

## 2022-06-15 DIAGNOSIS — N182 Chronic kidney disease, stage 2 (mild): Secondary | ICD-10-CM | POA: Diagnosis not present

## 2022-06-15 DIAGNOSIS — F02C4 Dementia in other diseases classified elsewhere, severe, with anxiety: Secondary | ICD-10-CM | POA: Diagnosis not present

## 2022-06-17 DIAGNOSIS — I739 Peripheral vascular disease, unspecified: Secondary | ICD-10-CM | POA: Diagnosis not present

## 2022-06-17 DIAGNOSIS — E441 Mild protein-calorie malnutrition: Secondary | ICD-10-CM | POA: Diagnosis not present

## 2022-06-17 DIAGNOSIS — Z993 Dependence on wheelchair: Secondary | ICD-10-CM | POA: Diagnosis not present

## 2022-06-17 DIAGNOSIS — G309 Alzheimer's disease, unspecified: Secondary | ICD-10-CM | POA: Diagnosis not present

## 2022-06-17 DIAGNOSIS — I10 Essential (primary) hypertension: Secondary | ICD-10-CM | POA: Diagnosis not present

## 2022-06-17 DIAGNOSIS — E559 Vitamin D deficiency, unspecified: Secondary | ICD-10-CM | POA: Diagnosis not present

## 2022-06-17 DIAGNOSIS — E876 Hypokalemia: Secondary | ICD-10-CM | POA: Diagnosis not present

## 2022-06-17 DIAGNOSIS — D509 Iron deficiency anemia, unspecified: Secondary | ICD-10-CM | POA: Diagnosis not present

## 2022-06-17 DIAGNOSIS — I129 Hypertensive chronic kidney disease with stage 1 through stage 4 chronic kidney disease, or unspecified chronic kidney disease: Secondary | ICD-10-CM | POA: Diagnosis not present

## 2022-06-17 DIAGNOSIS — K219 Gastro-esophageal reflux disease without esophagitis: Secondary | ICD-10-CM | POA: Diagnosis not present

## 2022-06-17 DIAGNOSIS — D631 Anemia in chronic kidney disease: Secondary | ICD-10-CM | POA: Diagnosis not present

## 2022-06-17 DIAGNOSIS — M24521 Contracture, right elbow: Secondary | ICD-10-CM | POA: Diagnosis not present

## 2022-06-17 DIAGNOSIS — F02C0 Dementia in other diseases classified elsewhere, severe, without behavioral disturbance, psychotic disturbance, mood disturbance, and anxiety: Secondary | ICD-10-CM | POA: Diagnosis not present

## 2022-06-17 DIAGNOSIS — G301 Alzheimer's disease with late onset: Secondary | ICD-10-CM | POA: Diagnosis not present

## 2022-06-17 DIAGNOSIS — Z9181 History of falling: Secondary | ICD-10-CM | POA: Diagnosis not present

## 2022-06-17 DIAGNOSIS — E785 Hyperlipidemia, unspecified: Secondary | ICD-10-CM | POA: Diagnosis not present

## 2022-06-17 DIAGNOSIS — N1831 Chronic kidney disease, stage 3a: Secondary | ICD-10-CM | POA: Diagnosis not present

## 2022-06-17 DIAGNOSIS — M24541 Contracture, right hand: Secondary | ICD-10-CM | POA: Diagnosis not present

## 2022-06-22 DIAGNOSIS — E785 Hyperlipidemia, unspecified: Secondary | ICD-10-CM | POA: Diagnosis not present

## 2022-06-22 DIAGNOSIS — N1831 Chronic kidney disease, stage 3a: Secondary | ICD-10-CM | POA: Diagnosis not present

## 2022-06-22 DIAGNOSIS — I10 Essential (primary) hypertension: Secondary | ICD-10-CM | POA: Diagnosis not present

## 2022-06-22 DIAGNOSIS — K219 Gastro-esophageal reflux disease without esophagitis: Secondary | ICD-10-CM | POA: Diagnosis not present

## 2022-06-22 DIAGNOSIS — E441 Mild protein-calorie malnutrition: Secondary | ICD-10-CM | POA: Diagnosis not present

## 2022-06-22 DIAGNOSIS — Z993 Dependence on wheelchair: Secondary | ICD-10-CM | POA: Diagnosis not present

## 2022-06-22 DIAGNOSIS — Z9181 History of falling: Secondary | ICD-10-CM | POA: Diagnosis not present

## 2022-06-22 DIAGNOSIS — M24521 Contracture, right elbow: Secondary | ICD-10-CM | POA: Diagnosis not present

## 2022-06-22 DIAGNOSIS — M24541 Contracture, right hand: Secondary | ICD-10-CM | POA: Diagnosis not present

## 2022-06-22 DIAGNOSIS — I129 Hypertensive chronic kidney disease with stage 1 through stage 4 chronic kidney disease, or unspecified chronic kidney disease: Secondary | ICD-10-CM | POA: Diagnosis not present

## 2022-06-22 DIAGNOSIS — D631 Anemia in chronic kidney disease: Secondary | ICD-10-CM | POA: Diagnosis not present

## 2022-06-22 DIAGNOSIS — E559 Vitamin D deficiency, unspecified: Secondary | ICD-10-CM | POA: Diagnosis not present

## 2022-06-22 DIAGNOSIS — I739 Peripheral vascular disease, unspecified: Secondary | ICD-10-CM | POA: Diagnosis not present

## 2022-06-22 DIAGNOSIS — E876 Hypokalemia: Secondary | ICD-10-CM | POA: Diagnosis not present

## 2022-06-22 DIAGNOSIS — G301 Alzheimer's disease with late onset: Secondary | ICD-10-CM | POA: Diagnosis not present

## 2022-06-22 DIAGNOSIS — D509 Iron deficiency anemia, unspecified: Secondary | ICD-10-CM | POA: Diagnosis not present

## 2022-06-22 DIAGNOSIS — F02C0 Dementia in other diseases classified elsewhere, severe, without behavioral disturbance, psychotic disturbance, mood disturbance, and anxiety: Secondary | ICD-10-CM | POA: Diagnosis not present

## 2022-06-25 DIAGNOSIS — I129 Hypertensive chronic kidney disease with stage 1 through stage 4 chronic kidney disease, or unspecified chronic kidney disease: Secondary | ICD-10-CM | POA: Diagnosis not present

## 2022-06-25 DIAGNOSIS — K219 Gastro-esophageal reflux disease without esophagitis: Secondary | ICD-10-CM | POA: Diagnosis not present

## 2022-06-25 DIAGNOSIS — I10 Essential (primary) hypertension: Secondary | ICD-10-CM | POA: Diagnosis not present

## 2022-06-25 DIAGNOSIS — E785 Hyperlipidemia, unspecified: Secondary | ICD-10-CM | POA: Diagnosis not present

## 2022-06-28 DIAGNOSIS — E559 Vitamin D deficiency, unspecified: Secondary | ICD-10-CM | POA: Diagnosis not present

## 2022-06-28 DIAGNOSIS — I739 Peripheral vascular disease, unspecified: Secondary | ICD-10-CM | POA: Diagnosis not present

## 2022-06-28 DIAGNOSIS — I10 Essential (primary) hypertension: Secondary | ICD-10-CM | POA: Diagnosis not present

## 2022-06-28 DIAGNOSIS — I129 Hypertensive chronic kidney disease with stage 1 through stage 4 chronic kidney disease, or unspecified chronic kidney disease: Secondary | ICD-10-CM | POA: Diagnosis not present

## 2022-06-28 DIAGNOSIS — Z993 Dependence on wheelchair: Secondary | ICD-10-CM | POA: Diagnosis not present

## 2022-06-28 DIAGNOSIS — D631 Anemia in chronic kidney disease: Secondary | ICD-10-CM | POA: Diagnosis not present

## 2022-06-28 DIAGNOSIS — M24521 Contracture, right elbow: Secondary | ICD-10-CM | POA: Diagnosis not present

## 2022-06-28 DIAGNOSIS — M24541 Contracture, right hand: Secondary | ICD-10-CM | POA: Diagnosis not present

## 2022-06-28 DIAGNOSIS — E876 Hypokalemia: Secondary | ICD-10-CM | POA: Diagnosis not present

## 2022-06-28 DIAGNOSIS — G301 Alzheimer's disease with late onset: Secondary | ICD-10-CM | POA: Diagnosis not present

## 2022-06-28 DIAGNOSIS — Z9181 History of falling: Secondary | ICD-10-CM | POA: Diagnosis not present

## 2022-06-28 DIAGNOSIS — E785 Hyperlipidemia, unspecified: Secondary | ICD-10-CM | POA: Diagnosis not present

## 2022-06-28 DIAGNOSIS — D509 Iron deficiency anemia, unspecified: Secondary | ICD-10-CM | POA: Diagnosis not present

## 2022-06-28 DIAGNOSIS — E441 Mild protein-calorie malnutrition: Secondary | ICD-10-CM | POA: Diagnosis not present

## 2022-06-28 DIAGNOSIS — F02C0 Dementia in other diseases classified elsewhere, severe, without behavioral disturbance, psychotic disturbance, mood disturbance, and anxiety: Secondary | ICD-10-CM | POA: Diagnosis not present

## 2022-06-28 DIAGNOSIS — N1831 Chronic kidney disease, stage 3a: Secondary | ICD-10-CM | POA: Diagnosis not present

## 2022-06-28 DIAGNOSIS — K219 Gastro-esophageal reflux disease without esophagitis: Secondary | ICD-10-CM | POA: Diagnosis not present

## 2022-06-30 DIAGNOSIS — G309 Alzheimer's disease, unspecified: Secondary | ICD-10-CM | POA: Diagnosis not present

## 2022-06-30 DIAGNOSIS — E46 Unspecified protein-calorie malnutrition: Secondary | ICD-10-CM | POA: Diagnosis not present

## 2022-07-12 DIAGNOSIS — I739 Peripheral vascular disease, unspecified: Secondary | ICD-10-CM | POA: Diagnosis not present

## 2022-07-12 DIAGNOSIS — Z9181 History of falling: Secondary | ICD-10-CM | POA: Diagnosis not present

## 2022-07-12 DIAGNOSIS — K219 Gastro-esophageal reflux disease without esophagitis: Secondary | ICD-10-CM | POA: Diagnosis not present

## 2022-07-12 DIAGNOSIS — E876 Hypokalemia: Secondary | ICD-10-CM | POA: Diagnosis not present

## 2022-07-12 DIAGNOSIS — M24541 Contracture, right hand: Secondary | ICD-10-CM | POA: Diagnosis not present

## 2022-07-12 DIAGNOSIS — M24521 Contracture, right elbow: Secondary | ICD-10-CM | POA: Diagnosis not present

## 2022-07-12 DIAGNOSIS — D631 Anemia in chronic kidney disease: Secondary | ICD-10-CM | POA: Diagnosis not present

## 2022-07-12 DIAGNOSIS — I10 Essential (primary) hypertension: Secondary | ICD-10-CM | POA: Diagnosis not present

## 2022-07-12 DIAGNOSIS — G301 Alzheimer's disease with late onset: Secondary | ICD-10-CM | POA: Diagnosis not present

## 2022-07-12 DIAGNOSIS — F02C0 Dementia in other diseases classified elsewhere, severe, without behavioral disturbance, psychotic disturbance, mood disturbance, and anxiety: Secondary | ICD-10-CM | POA: Diagnosis not present

## 2022-07-12 DIAGNOSIS — E441 Mild protein-calorie malnutrition: Secondary | ICD-10-CM | POA: Diagnosis not present

## 2022-07-12 DIAGNOSIS — E785 Hyperlipidemia, unspecified: Secondary | ICD-10-CM | POA: Diagnosis not present

## 2022-07-12 DIAGNOSIS — Z993 Dependence on wheelchair: Secondary | ICD-10-CM | POA: Diagnosis not present

## 2022-07-12 DIAGNOSIS — E559 Vitamin D deficiency, unspecified: Secondary | ICD-10-CM | POA: Diagnosis not present

## 2022-07-12 DIAGNOSIS — I129 Hypertensive chronic kidney disease with stage 1 through stage 4 chronic kidney disease, or unspecified chronic kidney disease: Secondary | ICD-10-CM | POA: Diagnosis not present

## 2022-07-12 DIAGNOSIS — N1831 Chronic kidney disease, stage 3a: Secondary | ICD-10-CM | POA: Diagnosis not present

## 2022-07-12 DIAGNOSIS — D509 Iron deficiency anemia, unspecified: Secondary | ICD-10-CM | POA: Diagnosis not present

## 2022-07-15 DIAGNOSIS — G309 Alzheimer's disease, unspecified: Secondary | ICD-10-CM | POA: Diagnosis not present

## 2022-07-19 ENCOUNTER — Inpatient Hospital Stay: Admit: 2022-07-19 | Payer: Medicare Other

## 2022-07-19 ENCOUNTER — Inpatient Hospital Stay: Payer: Medicare Other

## 2022-07-19 ENCOUNTER — Other Ambulatory Visit: Payer: Self-pay

## 2022-07-19 ENCOUNTER — Inpatient Hospital Stay (HOSPITAL_COMMUNITY)
Admit: 2022-07-19 | Discharge: 2022-07-19 | Disposition: A | Discharge status: Home / Self Care | Payer: Medicare Other | Attending: Critical Care Medicine | Admitting: Critical Care Medicine

## 2022-07-19 ENCOUNTER — Emergency Department: Payer: Medicare Other

## 2022-07-19 ENCOUNTER — Inpatient Hospital Stay
Admission: EM | Admit: 2022-07-19 | Discharge: 2022-07-22 | DRG: 871 | Disposition: A | Discharge status: Hospice | Payer: Medicare Other | Source: Skilled Nursing Facility | Attending: Pulmonary Disease | Admitting: Pulmonary Disease

## 2022-07-19 ENCOUNTER — Encounter: Payer: Self-pay | Admitting: Pharmacy Technician

## 2022-07-19 DIAGNOSIS — Z66 Do not resuscitate: Secondary | ICD-10-CM | POA: Diagnosis not present

## 2022-07-19 DIAGNOSIS — Z515 Encounter for palliative care: Secondary | ICD-10-CM | POA: Diagnosis not present

## 2022-07-19 DIAGNOSIS — W19XXXA Unspecified fall, initial encounter: Secondary | ICD-10-CM | POA: Diagnosis not present

## 2022-07-19 DIAGNOSIS — K7200 Acute and subacute hepatic failure without coma: Secondary | ICD-10-CM | POA: Diagnosis present

## 2022-07-19 DIAGNOSIS — I10 Essential (primary) hypertension: Secondary | ICD-10-CM | POA: Diagnosis present

## 2022-07-19 DIAGNOSIS — R404 Transient alteration of awareness: Secondary | ICD-10-CM | POA: Diagnosis not present

## 2022-07-19 DIAGNOSIS — K449 Diaphragmatic hernia without obstruction or gangrene: Secondary | ICD-10-CM | POA: Diagnosis present

## 2022-07-19 DIAGNOSIS — E875 Hyperkalemia: Secondary | ICD-10-CM | POA: Diagnosis present

## 2022-07-19 DIAGNOSIS — R778 Other specified abnormalities of plasma proteins: Secondary | ICD-10-CM

## 2022-07-19 DIAGNOSIS — E872 Acidosis, unspecified: Secondary | ICD-10-CM | POA: Diagnosis present

## 2022-07-19 DIAGNOSIS — F0394 Unspecified dementia, unspecified severity, with anxiety: Secondary | ICD-10-CM | POA: Diagnosis present

## 2022-07-19 DIAGNOSIS — F0390 Unspecified dementia without behavioral disturbance: Secondary | ICD-10-CM

## 2022-07-19 DIAGNOSIS — R739 Hyperglycemia, unspecified: Secondary | ICD-10-CM | POA: Diagnosis present

## 2022-07-19 DIAGNOSIS — R001 Bradycardia, unspecified: Secondary | ICD-10-CM | POA: Diagnosis present

## 2022-07-19 DIAGNOSIS — I442 Atrioventricular block, complete: Secondary | ICD-10-CM | POA: Diagnosis present

## 2022-07-19 DIAGNOSIS — G9341 Metabolic encephalopathy: Secondary | ICD-10-CM | POA: Diagnosis present

## 2022-07-19 DIAGNOSIS — N179 Acute kidney failure, unspecified: Secondary | ICD-10-CM | POA: Diagnosis not present

## 2022-07-19 DIAGNOSIS — J9601 Acute respiratory failure with hypoxia: Secondary | ICD-10-CM | POA: Diagnosis present

## 2022-07-19 DIAGNOSIS — I2489 Other forms of acute ischemic heart disease: Secondary | ICD-10-CM | POA: Diagnosis present

## 2022-07-19 DIAGNOSIS — R0603 Acute respiratory distress: Secondary | ICD-10-CM | POA: Diagnosis not present

## 2022-07-19 DIAGNOSIS — Z7189 Other specified counseling: Secondary | ICD-10-CM | POA: Diagnosis not present

## 2022-07-19 DIAGNOSIS — I4430 Unspecified atrioventricular block: Secondary | ICD-10-CM | POA: Diagnosis not present

## 2022-07-19 DIAGNOSIS — I071 Rheumatic tricuspid insufficiency: Secondary | ICD-10-CM | POA: Diagnosis present

## 2022-07-19 DIAGNOSIS — Z7401 Bed confinement status: Secondary | ICD-10-CM | POA: Diagnosis not present

## 2022-07-19 DIAGNOSIS — R4182 Altered mental status, unspecified: Secondary | ICD-10-CM | POA: Diagnosis present

## 2022-07-19 DIAGNOSIS — D649 Anemia, unspecified: Secondary | ICD-10-CM | POA: Diagnosis present

## 2022-07-19 DIAGNOSIS — I492 Junctional premature depolarization: Secondary | ICD-10-CM | POA: Diagnosis not present

## 2022-07-19 DIAGNOSIS — J690 Pneumonitis due to inhalation of food and vomit: Secondary | ICD-10-CM | POA: Diagnosis present

## 2022-07-19 DIAGNOSIS — I499 Cardiac arrhythmia, unspecified: Secondary | ICD-10-CM | POA: Diagnosis not present

## 2022-07-19 DIAGNOSIS — Z841 Family history of disorders of kidney and ureter: Secondary | ICD-10-CM

## 2022-07-19 DIAGNOSIS — Z79899 Other long term (current) drug therapy: Secondary | ICD-10-CM

## 2022-07-19 DIAGNOSIS — R570 Cardiogenic shock: Secondary | ICD-10-CM | POA: Diagnosis present

## 2022-07-19 DIAGNOSIS — I110 Hypertensive heart disease with heart failure: Secondary | ICD-10-CM | POA: Diagnosis present

## 2022-07-19 DIAGNOSIS — N170 Acute kidney failure with tubular necrosis: Secondary | ICD-10-CM | POA: Diagnosis present

## 2022-07-19 DIAGNOSIS — R6521 Severe sepsis with septic shock: Secondary | ICD-10-CM | POA: Diagnosis present

## 2022-07-19 DIAGNOSIS — A419 Sepsis, unspecified organism: Secondary | ICD-10-CM | POA: Diagnosis present

## 2022-07-19 DIAGNOSIS — Z743 Need for continuous supervision: Secondary | ICD-10-CM | POA: Diagnosis not present

## 2022-07-19 DIAGNOSIS — Z7982 Long term (current) use of aspirin: Secondary | ICD-10-CM

## 2022-07-19 DIAGNOSIS — I5031 Acute diastolic (congestive) heart failure: Secondary | ICD-10-CM | POA: Diagnosis present

## 2022-07-19 DIAGNOSIS — Z1152 Encounter for screening for COVID-19: Secondary | ICD-10-CM | POA: Diagnosis not present

## 2022-07-19 DIAGNOSIS — I498 Other specified cardiac arrhythmias: Secondary | ICD-10-CM | POA: Diagnosis not present

## 2022-07-19 DIAGNOSIS — R6889 Other general symptoms and signs: Secondary | ICD-10-CM | POA: Diagnosis not present

## 2022-07-19 DIAGNOSIS — R6520 Severe sepsis without septic shock: Secondary | ICD-10-CM | POA: Diagnosis not present

## 2022-07-19 DIAGNOSIS — Z8249 Family history of ischemic heart disease and other diseases of the circulatory system: Secondary | ICD-10-CM

## 2022-07-19 DIAGNOSIS — Z7951 Long term (current) use of inhaled steroids: Secondary | ICD-10-CM

## 2022-07-19 DIAGNOSIS — J189 Pneumonia, unspecified organism: Secondary | ICD-10-CM | POA: Diagnosis present

## 2022-07-19 DIAGNOSIS — J984 Other disorders of lung: Secondary | ICD-10-CM | POA: Diagnosis not present

## 2022-07-19 DIAGNOSIS — R7989 Other specified abnormal findings of blood chemistry: Secondary | ICD-10-CM | POA: Diagnosis not present

## 2022-07-19 LAB — CBC WITH DIFFERENTIAL/PLATELET
Abs Immature Granulocytes: 0.82 10*3/uL — ABNORMAL HIGH (ref 0.00–0.07)
Basophils Absolute: 0 10*3/uL (ref 0.0–0.1)
Basophils Relative: 0 %
Eosinophils Absolute: 0 10*3/uL (ref 0.0–0.5)
Eosinophils Relative: 0 %
HCT: 31.4 % — ABNORMAL LOW (ref 36.0–46.0)
Hemoglobin: 9.8 g/dL — ABNORMAL LOW (ref 12.0–15.0)
Immature Granulocytes: 6 %
Lymphocytes Relative: 7 %
Lymphs Abs: 0.9 10*3/uL (ref 0.7–4.0)
MCH: 32.7 pg (ref 26.0–34.0)
MCHC: 31.2 g/dL (ref 30.0–36.0)
MCV: 104.7 fL — ABNORMAL HIGH (ref 80.0–100.0)
Monocytes Absolute: 4.3 10*3/uL — ABNORMAL HIGH (ref 0.1–1.0)
Monocytes Relative: 33 %
Neutro Abs: 6.9 10*3/uL (ref 1.7–7.7)
Neutrophils Relative %: 54 %
Platelets: 168 10*3/uL (ref 150–400)
RBC: 3 MIL/uL — ABNORMAL LOW (ref 3.87–5.11)
RDW: 19.8 % — ABNORMAL HIGH (ref 11.5–15.5)
Smear Review: NORMAL
WBC: 12.9 10*3/uL — ABNORMAL HIGH (ref 4.0–10.5)
nRBC: 1 % — ABNORMAL HIGH (ref 0.0–0.2)

## 2022-07-19 LAB — BLOOD GAS, ARTERIAL
Acid-base deficit: 11.8 mmol/L — ABNORMAL HIGH (ref 0.0–2.0)
Bicarbonate: 15.1 mmol/L — ABNORMAL LOW (ref 20.0–28.0)
O2 Content: 6 L/min
O2 Saturation: 94 %
Patient temperature: 37
pCO2 arterial: 37 mmHg (ref 32–48)
pH, Arterial: 7.22 — ABNORMAL LOW (ref 7.35–7.45)
pO2, Arterial: 74 mmHg — ABNORMAL LOW (ref 83–108)

## 2022-07-19 LAB — D-DIMER, QUANTITATIVE: D-Dimer, Quant: 3.3 ug/mL-FEU — ABNORMAL HIGH (ref 0.00–0.50)

## 2022-07-19 LAB — LACTIC ACID, PLASMA
Lactic Acid, Venous: >9 mmol/L (ref 0.5–1.9)
Lactic Acid, Venous: >9 mmol/L (ref 0.5–1.9)
Lactic Acid, Venous: 5.5 mmol/L (ref 0.5–1.9)
Lactic Acid, Venous: 4.3 mmol/L (ref 0.5–1.9)

## 2022-07-19 LAB — COMPREHENSIVE METABOLIC PANEL
ALT: 182 U/L — ABNORMAL HIGH (ref 0–44)
AST: 193 U/L — ABNORMAL HIGH (ref 15–41)
Albumin: 4.2 g/dL (ref 3.5–5.0)
Alkaline Phosphatase: 76 U/L (ref 38–126)
Anion gap: 18 — ABNORMAL HIGH (ref 5–15)
BUN: 80 mg/dL — ABNORMAL HIGH (ref 8–23)
CO2: 12 mmol/L — ABNORMAL LOW (ref 22–32)
Calcium: 9.4 mg/dL (ref 8.9–10.3)
Chloride: 109 mmol/L (ref 98–111)
Creatinine, Ser: 2.97 mg/dL — ABNORMAL HIGH (ref 0.44–1.00)
GFR, Estimated: 15 mL/min — ABNORMAL LOW (ref >60)
Glucose, Bld: 176 mg/dL — ABNORMAL HIGH (ref 70–99)
Potassium: 7 mmol/L (ref 3.5–5.1)
Sodium: 139 mmol/L (ref 135–145)
Total Bilirubin: 1.6 mg/dL — ABNORMAL HIGH (ref 0.3–1.2)
Total Protein: 7.7 g/dL (ref 6.5–8.1)

## 2022-07-19 LAB — BASIC METABOLIC PANEL
Anion gap: 15 (ref 5–15)
Anion gap: 14 (ref 5–15)
BUN: 74 mg/dL — ABNORMAL HIGH (ref 8–23)
BUN: 75 mg/dL — ABNORMAL HIGH (ref 8–23)
CO2: 18 mmol/L — ABNORMAL LOW (ref 22–32)
CO2: 23 mmol/L (ref 22–32)
Calcium: 9.2 mg/dL (ref 8.9–10.3)
Calcium: 9.4 mg/dL (ref 8.9–10.3)
Chloride: 108 mmol/L (ref 98–111)
Chloride: 107 mmol/L (ref 98–111)
Creatinine, Ser: 2.43 mg/dL — ABNORMAL HIGH (ref 0.44–1.00)
Creatinine, Ser: 2.32 mg/dL — ABNORMAL HIGH (ref 0.44–1.00)
GFR, Estimated: 19 mL/min — ABNORMAL LOW (ref >60)
GFR, Estimated: 20 mL/min — ABNORMAL LOW (ref >60)
Glucose, Bld: 96 mg/dL (ref 70–99)
Glucose, Bld: 100 mg/dL — ABNORMAL HIGH (ref 70–99)
Potassium: 4.9 mmol/L (ref 3.5–5.1)
Potassium: 5.1 mmol/L (ref 3.5–5.1)
Sodium: 141 mmol/L (ref 135–145)
Sodium: 144 mmol/L (ref 135–145)

## 2022-07-19 LAB — MAGNESIUM: Magnesium: 2.9 mg/dL — ABNORMAL HIGH (ref 1.7–2.4)

## 2022-07-19 LAB — C-REACTIVE PROTEIN: CRP: 0.6 mg/dL (ref <1.0)

## 2022-07-19 LAB — ECHOCARDIOGRAM COMPLETE
AR max vel: 2.84 cm2
AV Area VTI: 2.45 cm2
AV Area mean vel: 2.54 cm2
AV Mean grad: 4 mmHg
AV Peak grad: 6.5 mmHg
Ao pk vel: 1.27 m/s
Area-P 1/2: 1.84 cm2
Height: 57 in
MV VTI: 1.16 cm2
S' Lateral: 1.3 cm
Weight: 1763.68 oz

## 2022-07-19 LAB — HEMOGLOBIN A1C
Hgb A1c MFr Bld: 4.7 % — ABNORMAL LOW (ref 4.8–5.6)
Mean Plasma Glucose: 88.19 mg/dL

## 2022-07-19 LAB — TROPONIN I (HIGH SENSITIVITY)
Troponin I (High Sensitivity): 30 ng/L — ABNORMAL HIGH (ref <18)
Troponin I (High Sensitivity): 44 ng/L — ABNORMAL HIGH (ref <18)

## 2022-07-19 LAB — BLOOD GAS, VENOUS
Acid-base deficit: 2.3 mmol/L — ABNORMAL HIGH (ref 0.0–2.0)
Bicarbonate: 25 mmol/L (ref 20.0–28.0)
O2 Saturation: 19.7 %
Patient temperature: 37
pCO2, Ven: 52 mmHg (ref 44–60)
pH, Ven: 7.29 (ref 7.25–7.43)
pO2, Ven: <31 mmHg — CL (ref 32–45)

## 2022-07-19 LAB — CBG MONITORING, ED: Glucose-Capillary: 159 mg/dL — ABNORMAL HIGH (ref 70–99)

## 2022-07-19 LAB — PROTIME-INR
INR: 1.8 — ABNORMAL HIGH (ref 0.8–1.2)
Prothrombin Time: 20.8 seconds — ABNORMAL HIGH (ref 11.4–15.2)

## 2022-07-19 LAB — PHOSPHORUS: Phosphorus: 7.9 mg/dL — ABNORMAL HIGH (ref 2.5–4.6)

## 2022-07-19 LAB — CORTISOL: Cortisol, Plasma: 47.3 ug/dL

## 2022-07-19 LAB — APTT: aPTT: 32 seconds (ref 24–36)

## 2022-07-19 LAB — GLUCOSE, CAPILLARY
Glucose-Capillary: 136 mg/dL — ABNORMAL HIGH (ref 70–99)
Glucose-Capillary: 91 mg/dL (ref 70–99)
Glucose-Capillary: 86 mg/dL (ref 70–99)
Glucose-Capillary: 82 mg/dL (ref 70–99)

## 2022-07-19 LAB — PROCALCITONIN: Procalcitonin: <0.1 ng/mL

## 2022-07-19 LAB — FOLATE: Folate: 31 ng/mL (ref >5.9)

## 2022-07-19 LAB — MRSA NEXT GEN BY PCR, NASAL: MRSA by PCR Next Gen: NOT DETECTED

## 2022-07-19 LAB — VITAMIN B12: Vitamin B-12: 642 pg/mL (ref 180–914)

## 2022-07-19 LAB — TSH: TSH: 3.171 u[IU]/mL (ref 0.350–4.500)

## 2022-07-19 LAB — BRAIN NATRIURETIC PEPTIDE: B Natriuretic Peptide: 3924.5 pg/mL — ABNORMAL HIGH (ref 0.0–100.0)

## 2022-07-19 LAB — SARS CORONAVIRUS 2 BY RT PCR: SARS Coronavirus 2 by RT PCR: NEGATIVE

## 2022-07-19 MED ORDER — POLYETHYLENE GLYCOL 3350 17 G PO PACK: 17.0000 g | PACK | Freq: Every day | ORAL | Status: DC | PRN

## 2022-07-19 MED ORDER — DOCUSATE SODIUM 100 MG PO CAPS: 100.0000 mg | ORAL_CAPSULE | Freq: Two times a day (BID) | ORAL | Status: DC | PRN

## 2022-07-19 MED ORDER — CHLORHEXIDINE GLUCONATE CLOTH 2 % EX PADS
6.0000 | MEDICATED_PAD | Freq: Every day | CUTANEOUS | Status: DC
  Administered 2022-07-19 – 2022-07-21 (×3): 6 via TOPICAL
  Filled 2022-07-19: qty 6

## 2022-07-19 MED ORDER — SODIUM CHLORIDE 0.9 % IV BOLUS
500.0000 mL | Freq: Once | INTRAVENOUS | Status: AC
  Administered 2022-07-19: 500 mL via INTRAVENOUS

## 2022-07-19 MED ORDER — ALBUTEROL SULFATE (2.5 MG/3ML) 0.083% IN NEBU
10.0000 mg | INHALATION_SOLUTION | Freq: Once | RESPIRATORY_TRACT | Status: AC
  Administered 2022-07-19: 10 mg via RESPIRATORY_TRACT

## 2022-07-19 MED ORDER — PIPERACILLIN-TAZOBACTAM IN DEX 2-0.25 GM/50ML IV SOLN
2.2500 g | Freq: Three times a day (TID) | INTRAVENOUS | Status: DC
  Administered 2022-07-19 – 2022-07-22 (×9): 2.25 g via INTRAVENOUS
  Filled 2022-07-19 (×10): qty 50

## 2022-07-19 MED ORDER — SODIUM BICARBONATE 8.4 % IV SOLN
50.0000 meq | Freq: Once | INTRAVENOUS | Status: AC
  Administered 2022-07-19: 50 meq via INTRAVENOUS
  Filled 2022-07-19: qty 50

## 2022-07-19 MED ORDER — SODIUM CHLORIDE 0.9 % IV SOLN
2.0000 g | Freq: Once | INTRAVENOUS | Status: AC
  Administered 2022-07-19: 2 g via INTRAVENOUS
  Filled 2022-07-19: qty 12.5

## 2022-07-19 MED ORDER — DEXTROSE 50 % IV SOLN
1.0000 | Freq: Once | INTRAVENOUS | Status: AC
  Administered 2022-07-19: 50 mL via INTRAVENOUS
  Filled 2022-07-19: qty 50

## 2022-07-19 MED ORDER — SODIUM ZIRCONIUM CYCLOSILICATE 10 G PO PACK
10.0000 g | PACK | Freq: Once | ORAL | Status: AC
  Administered 2022-07-19: 10 g via ORAL
  Filled 2022-07-19: qty 1

## 2022-07-19 MED ORDER — SODIUM BICARBONATE 8.4 % IV SOLN
150.0000 meq | Freq: Once | INTRAVENOUS | Status: AC
  Administered 2022-07-19: 150 meq via INTRAVENOUS
  Filled 2022-07-19: qty 50

## 2022-07-19 MED ORDER — HEPARIN SODIUM (PORCINE) 5000 UNIT/ML IJ SOLN
5000.0000 [IU] | Freq: Three times a day (TID) | INTRAMUSCULAR | Status: DC
  Administered 2022-07-19 – 2022-07-22 (×9): 5000 [IU] via SUBCUTANEOUS
  Filled 2022-07-19 (×9): qty 1

## 2022-07-19 MED ORDER — FENTANYL CITRATE PF 50 MCG/ML IJ SOSY
PREFILLED_SYRINGE | INTRAMUSCULAR | Status: DC | PRN
  Administered 2022-07-19: 50 ug via INTRAVENOUS

## 2022-07-19 MED ORDER — ORAL CARE MOUTH RINSE
15.0000 mL | OROMUCOSAL | Status: DC
  Administered 2022-07-19 – 2022-07-22 (×9): 15 mL via OROMUCOSAL

## 2022-07-19 MED ORDER — FENTANYL CITRATE PF 50 MCG/ML IJ SOSY
PREFILLED_SYRINGE | INTRAMUSCULAR | Status: AC
  Filled 2022-07-19: qty 1

## 2022-07-19 MED ORDER — IPRATROPIUM-ALBUTEROL 0.5-2.5 (3) MG/3ML IN SOLN: 3.0000 mL | Freq: Four times a day (QID) | RESPIRATORY_TRACT | Status: DC | PRN

## 2022-07-19 MED ORDER — IPRATROPIUM-ALBUTEROL 0.5-2.5 (3) MG/3ML IN SOLN
3.0000 mL | Freq: Four times a day (QID) | RESPIRATORY_TRACT | Status: DC
  Administered 2022-07-19: 3 mL via RESPIRATORY_TRACT
  Filled 2022-07-19 (×2): qty 3

## 2022-07-19 MED ORDER — INSULIN ASPART 100 UNIT/ML IV SOLN
10.0000 [IU] | Freq: Once | INTRAVENOUS | Status: AC
  Administered 2022-07-19: 10 [IU] via INTRAVENOUS
  Filled 2022-07-19: qty 0.1

## 2022-07-19 MED ORDER — INSULIN ASPART 100 UNIT/ML IJ SOLN
0.0000 [IU] | INTRAMUSCULAR | Status: DC
  Administered 2022-07-19: 1 [IU] via SUBCUTANEOUS
  Filled 2022-07-19: qty 1

## 2022-07-19 MED ORDER — ORAL CARE MOUTH RINSE: 15.0000 mL | OROMUCOSAL | Status: DC | PRN

## 2022-07-19 MED ORDER — CALCIUM GLUCONATE 10 % IV SOLN
1.0000 g | Freq: Once | INTRAVENOUS | Status: AC
  Administered 2022-07-19: 1 g via INTRAVENOUS
  Filled 2022-07-19: qty 10

## 2022-07-19 MED ORDER — VANCOMYCIN HCL IN DEXTROSE 1-5 GM/200ML-% IV SOLN
1000.0000 mg | Freq: Once | INTRAVENOUS | Status: AC
  Administered 2022-07-19: 1000 mg via INTRAVENOUS
  Filled 2022-07-19: qty 200
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
## 2022-07-19 NOTE — ED Notes (Signed)
Programme researcher, broadcasting/film/video, guardian, [phone]

## 2022-07-19 NOTE — Progress Notes (Signed)
Pt arrived to unit at this time. Pt is being transcutaneously paced on zoll at 7mA and 70BMP. Pt is alert and can answer simple questions and follow simple commands. VSS on 6L [place]. Pt's left AC IV leaking and right hand IV will not flush, attempting to start another IV. IV team consulted. Bladder scan performed and pt had 0ml in bladder. Pt's daughter brought to bedside.

## 2022-07-19 NOTE — H&P (Signed)
NAME:  Jasmin Roth, MRN:  [id number], DOB:  September 15, 1933, LOS: 0 ADMISSION DATE:  07/19/2022, CONSULTATION DATE: 07/19/2022 REFERRING MD: Dr. Marisa Severin, CHIEF COMPLAINT: Symptomatic Bradycardia   History of Present Illness:  This is an 89 yo female who presented to [facility] ER from [facility] via EMS on 07/15 with lethargy and altered mental status.  Per ER notes EMS reported pts heart rate initially in the 20's and sbp in the 60's.  Initial EKG showed complete heart block.  EMS proceeded to transcutaneously pace pt.  ED Course  Upon arrival to the ER while transitioning pt to zoll monitor pts heart rate went down to the 30's.  Transcutaneous pacing reestablished with a capture at 70 mA and rate of 80.  Pt also noted to be hypoxic with O2 sats in the 80's on 2L O2 via nasal canula, therefore O2 increased to 6L.  Significant lab results were: K+ 7.0/CO2 12/glucose 176/BUN 80/creatinine 2.97/anion gap 18/AST 193/ALT 182/BNP 3,924.5/troponin 30/lactic acid >9.0/wbc 12.9/hgb 9.8/COVID negative.  CXR concerning for pulmonary edema vs pneumonia.   Pt received 1g of calcium gluconate/cefepime/10 units of iv novolog/1 amp of D50W/1 amp of sodium bicarb/500 ml NS bolus/vancomycin.  Cardiology consulted per EDP for possible need for transvenous pacemaker placement. PCCM team contacted for ICU admission.      Pertinent  Medical History  Dementia  HTN   Significant Hospital Events: Including procedures, antibiotic start and stop dates in addition to other pertinent events   07/15: Pt admitted to ICU with complete heart block, acute hypoxic respiratory failure secondary to decompensated CHF vs. possible; acute kidney injury with hyperkalemia and metabolic acidosis, and severe lactic acidosis requiring transcutaneous pacing   Micro Data:   07/15: COVID>>negative  07/15: Blood x2>> 07/15: MRSA PCR>>  Anti-infectives (From admission, onward)    Start     Dose/Rate Route Frequency Ordered Stop   07/19/22  1000  ceFEPIme (MAXIPIME) 2 g in sodium chloride 0.9 % 100 mL IVPB        2 g 200 mL/hr over 30 Minutes Intravenous  Once 07/19/22 0951 07/19/22 1112   07/19/22 1000  vancomycin (VANCOCIN) IVPB 1000 mg/200 mL premix        1,000 mg 200 mL/hr over 60 Minutes Intravenous  Once 07/19/22 0951 07/19/22 1122      Interim History / Subjective:  Pt tachypneic respiratory rate mid to upper 30's with accessory muscle use.  Pt remains transcutaneously paced heart rate 70.    Objective   Blood pressure (!) 127/58, pulse 70, temperature (!) 96.4 F (35.8 C), temperature source Rectal, resp. rate (!) 28, SpO2 100%.       No intake or output data in the 24 hours ending 07/19/22 1132 There were no vitals filed for this visit.  Examination: General: Acute on chronically-ill appearing female, in respiratory distress on 6L O2 via nasal canula   HENT: JVD present  Lungs: Rhonchi throughout, tachypneic with accessory muscle use  Cardiovascular: Paced rhythm, no r/g, 2+ radial/2+ distal pulses, no edema  Abdomen: Hypoactive BS x4, obese, soft, non distended, non tender Extremities: Normal bulk and tone, moves all extremities  Neuro: Awake but confused, following commands, PERRLA  GU: Deferred   Resolved Hospital Problem list    Assessment & Plan:   #Acute metabolic encephalopathy  #Dementia appears advanced  - Correct metabolic derangements  - Avoid sedating medications when able - Frequent reorientation   #Acute hypoxic respiratory failure secondary to decompensated CHF and possible pneumonia  -  Supplemental O2 for dyspnea and/or hypoxia  - Maintain O2 sats 92% or higher  - Scheduled and prn bronchodilator therapy  - Intermittent CXR and ABG's  - HIGH RISK FOR INTUBATION   #Complete heart block  #Cardiogenic and possible septic shock  #New onset decompensated CHF #Mildly elevated troponin suspect secondary to demand ischemia  Hx: HTN  - Continuous telemetry monitoring  - Trend  troponin's until peaked  - TSH, free T3, and cortisol level results pending  - Cardiology consulted appreciate input: recommended continued goals of care conversations due to overall poor prognosis  - Hold outpatient antihypertensives for now  - Avoid beta blocker therapy  - Echo pending  - Prn transcutaneous pacing for goal heart rate of at least 70 bpm   #Acute kidney injury with hyperkalemia and severe metabolic acidosis secondary to ATN  #Severe lactic acidosis  - Trend BMP, lactic acid, and vbg  - Replace electrolytes as indicated  - Monitor UOP  - Will administer 150 meq sodium bicarb   #Transaminitis secondary to hypotension  - Trend hepatic function panel  - Avoid hepatotoxic medications  - CT Abd/Pelvis pending   #Possible pneumonia suspect aspiration in setting of large hiatal hernia  - Trend WBC and monitor fever curve  - Trend PCT  - Follow cultures  - Continue abx as outlined above  #Anemia without obvious acute blood loss  - Trend CBC  - Monitor for s/sx of bleeding  - Transfuse for hgb <7 - Vitamin B12 and folic acid levels pending  #Hyperglycemia  - CBG's q4hrs  - SSI  - Target CBG reading: 140 to 180 - Hemoglobin A1c pending   Best Practice (right click and "Reselect all SmartList Selections" daily)   Diet/type: NPO DVT prophylaxis: prophylactic heparin  GI prophylaxis: N/A Lines: N/A Foley:  N/A Code Status:  full code Last date of multidisciplinary goals of care discussion [N/A]  07/15: Updated daughter at bedside regarding pts condition and current plan of care.  Also, contacted pts legal guardian Teddy Spike via telephone regarding pts condition and poor prognosis   Pt with dementia suspect advanced currently admitted with multiorgan failure along with complete heart block.  Overall, prognosis is extremely poor and pt HIGH risk for cardiac arrest and sudden death.  Recommend changing code status to DNR/DNI and considering transitioning pt to  comfort measures only.  Palliative Care Team consulted to assist with goals of treatment  Labs   CBC: Recent Labs  Lab 07/19/22 0850  WBC 12.9*  NEUTROABS 6.9  HGB 9.8*  HCT 31.4*  MCV 104.7*  PLT 168    Basic Metabolic Panel: Recent Labs  Lab 07/19/22 0850  NA 139  K 7.0*  CL 109  CO2 12*  GLUCOSE 176*  BUN 80*  CREATININE 2.97*  CALCIUM 9.4   GFR: CrCl cannot be calculated (Unknown ideal weight.). Recent Labs  Lab 07/19/22 0850 07/19/22 0904  WBC 12.9*  --   LATICACIDVEN >9.0* >9.0*    Liver Function Tests: Recent Labs  Lab 07/19/22 0850  AST 193*  ALT 182*  ALKPHOS 76  BILITOT 1.6*  PROT 7.7  ALBUMIN 4.2   No results for input(s): "LIPASE", "AMYLASE" in the last 168 hours. No results for input(s): "AMMONIA" in the last 168 hours.  ABG No results found for: "PHART", "PCO2ART", "PO2ART", "HCO3", "TCO2", "ACIDBASEDEF", "O2SAT"   Coagulation Profile: No results for input(s): "INR", "PROTIME" in the last 168 hours.  Cardiac Enzymes: No results for input(s): "CKTOTAL", "CKMB", "  CKMBINDEX", "TROPONINI" in the last 168 hours.  HbA1C: Hgb A1c MFr Bld  Date/Time Value Ref Range Status  10/16/2019 02:27 PM 4.7 (L) 4.8 - 5.6 % Final    Comment:             Prediabetes: 5.7 - 6.4          Diabetes: >6.4          Glycemic control for adults with diabetes: <7.0     CBG: Recent Labs  Lab 07/19/22 0908  GLUCAP 159*    Review of Systems:   Unable to assess pt confused and has dementia   Past Medical History:  She,  has a past medical history of Hypertension.   Surgical History:   Past Surgical History:  Procedure Laterality Date   CESAREAN SECTION     ELBOW LIGAMENT RECONSTRUCTION     TONSILLECTOMY AND ADENOIDECTOMY       Social History:   reports that she has never smoked. She has never used smokeless tobacco. She reports that she does not drink alcohol and does not use drugs.   Family History:  Her family history includes Heart  failure in her mother; Hypertension in her mother; Kidney failure in her father.   Allergies No Known Allergies   Home Medications  Prior to Admission medications   Medication Sig Start Date End Date Taking? Authorizing Provider  ADVAIR DISKUS 100-50 MCG/ACT AEPB Inhale 1 puff into the lungs in the morning and at bedtime. 03/25/21   [provider]  amLODipine (NORVASC) 5 MG tablet TAKE 1 TABLET BY MOUTH DAILY 01/07/21   Malva Limes, MD  ASPIRIN LOW DOSE 81 MG EC tablet TAKE 1 TABLET BY MOUTH ONCE A DAY 01/07/21   Malva Limes, MD  ferrous sulfate 325 (65 FE) MG EC tablet Take 325 mg by mouth daily.     [provider]  hydrochlorothiazide (HYDRODIURIL) 12.5 MG tablet Take 1 tablet by mouth daily. 07/23/21   [provider]  metoprolol tartrate (LOPRESSOR) 25 MG tablet TAKE 1/2 TABLET BY MOUTH DAILY 01/07/21   Malva Limes, MD  mupirocin cream (BACTROBAN) 2 % Apply 1 application topically 2 (two) times daily. 05/30/20   Malva Limes, MD  omeprazole (PRILOSEC) 20 MG capsule Take 1 capsule by mouth 1 day or 1 dose. 04/01/21   [provider]  potassium chloride SA (KLOR-CON M) 20 MEQ tablet TAKE 1 TABLET BY MOUTH ONCE A DAY 01/07/21   Malva Limes, MD  valsartan (DIOVAN) 40 MG tablet TAKE 1 TABLET BY MOUTH ONCE DAILY 01/07/21   Malva Limes, MD  vitamin C (ASCORBIC ACID) 500 MG tablet Take 500 mg by mouth daily. 04/16/21   [provider]     Critical care time: 62 minutes     Zada Girt, AGNP  Pulmonary/Critical Care Pager [phone] (please enter 7 digits) PCCM Consult Pager [phone] (please enter 7 digits)

## 2022-07-19 NOTE — Consult Note (Signed)
PHARMACY -  BRIEF ANTIBIOTIC NOTE   Pharmacy has received consult(s) for vancomycin and cefepime from an ED provider.  The patient's profile has been reviewed for ht/wt/allergies/indication/available labs.    One time order(s) placed for  --Cefepime 2 g IV --Vancomycin 1 g IV  Further antibiotics/pharmacy consults should be ordered by admitting physician if indicated.                       Thank you, Tressie Ellis 07/19/2022  10:45 AM

## 2022-07-19 NOTE — Consult Note (Signed)
Pharmacy Antibiotic Note  Jasmin Roth is a 89 y.o. female admitted on 07/19/2022 with  lethargy and altered mental  status .  Patient admitted to ICU with complete heart block, acute hypoxic respiratory failure secondary to decompensated CHF vs. possible; acute kidney injury with hyperkalemia and metabolic acidosis, and severe lactic acidosis requiring transcutaneous pacing Pharmacy has been consulted for Vancomycin and Zosyn dosing.  Plan: Vancomycin 1g IV x 1 given as loading dose Defer AUC dosing and further doses currently and recheck renal function with morning labs and adjust dose as appropriate  Zosyn 2.25g Q8 hours(renally adjusted)  Height: 4\' 9"  (144.8 cm) Weight: 50 kg (110 lb 3.7 oz) IBW/kg (Calculated) : 38.6  Temp (24hrs), Avg:96.7 F (35.9 C), Min:96.4 F (35.8 C), Max:96.9 F (36.1 C)  Recent Labs  Lab 07/19/22 0850 07/19/22 0904 07/19/22 1354  WBC 12.9*  --   --   CREATININE 2.97*  --  2.43*  LATICACIDVEN >9.0* >9.0* 5.5*    Estimated Creatinine Clearance: 11.3 mL/min (A) (by C-G formula based on SCr of 2.43 mg/dL (H)).    No Known Allergies  Antimicrobials this admission: Vancomycin7/15  >>  Zosyn 7/15 >>  Cefepime 7/15 x 1  Dose adjustments this admission: N/A  Microbiology results: 7/15 BCx: collected 7/15 MRSA PCR: negative  Thank you for allowing pharmacy to be a part of this patient's care.  Harrell Lark A Hermila Millis 07/19/2022 3:04 PM

## 2022-07-19 NOTE — Consult Note (Signed)
Cardiology Consultation   Patient ID: Jasmin Roth MRN: [id number]; DOB: 04/19/1933  Admit date: 07/19/2022 Date of Consult: 07/19/2022  PCP:  Malva Limes, MD   [facility] Providers Cardiologist:  None      New consult completed by Dr Kirke Corin  Patient Profile:   Jasmin Roth is a 89 y.o. female with a hx of hypertension, dementia who is being seen 07/19/2022 for the evaluation of symptomatic bradycardia at the request of Dr. Marisa Severin.  History of Present Illness:   Jasmin Roth presented to the [facility] emergency department from [facility] via EMS earlier this morning with lethargy and altered mental status.  Per ER notes EMS reported the patient's heart rate initially was in the 20s and systolic blood pressure was in the 60s.  Initial EKG revealed complete heart block.  EMS proceeded to transcutaneously pace the patient and route to the emergency department.  She was transitioned from the EMS monitor to the [facility] monitor and her heart rate dropped down into the 30s.  Transcutaneous pacing was reestablished with a capture at 70 mA and rate of 80.  Patient is noted to be hypoxic with O2 sats in the 80s on 2 L of O2 via nasal cannula therefore oxygen was increased to 6 L.  With her baseline of dementia she was unable to provide any review of systems or history the majority of her information is from her chart.  Cardiology was called by the ED provider for the potential need for transvenous pacing.  Initial vitals: Blood pressure 138/54, pulse of 80, respirations of 34, oxygen saturations of 77%  Pertinent labs: Potassium 7, CO2 12, blood glucose 176, BUN of 80, serum creatinine of 2.97 AST 193, ALT 182, total bilirubin 1.6, GFR 15, anion gap 18, WBCs 12.9, hemoglobin 9.8, hematocrit 31.4, high-sensitivity troponin 30, BNP 3924.5, lactic acid greater than 9, D-dimer 3.30, magnesium 2.9, COVID PCR swab negative  Imaging: Chest x-ray revealed low volume film with diffuse  interstitial and patchy airspace disease bilaterally, imaging features could be related to edema or infection, presumed large hiatal hernia, CT imaging may be helpful to further evaluate  Medications administered in the emergency department: 1 g of calcium gluconate, cefepime, 10 units of IV NovoLog, 1 amp of D50, 1 amp of sodium bicarb, 500 mL normal saline bolus, and vancomycin   Past Medical History:  Diagnosis Date   Hypertension     Past Surgical History:  Procedure Laterality Date   CESAREAN SECTION     ELBOW LIGAMENT RECONSTRUCTION     TONSILLECTOMY AND ADENOIDECTOMY       Home Medications:  Prior to Admission medications   Medication Sig Start Date End Date Taking? Authorizing Provider  amLODipine (NORVASC) 5 MG tablet TAKE 1 TABLET BY MOUTH DAILY 01/07/21  Yes Malva Limes, MD  ferrous sulfate 325 (65 FE) MG EC tablet Take 325 mg by mouth daily.    Yes [provider]  hydrochlorothiazide (HYDRODIURIL) 12.5 MG tablet Take 1 tablet by mouth daily. 07/23/21  Yes [provider]  hydrOXYzine (ATARAX) 10 MG tablet Take 10 mg by mouth every 8 (eight) hours as needed for anxiety.   Yes [provider]  metoprolol tartrate (LOPRESSOR) 25 MG tablet TAKE 1/2 TABLET BY MOUTH DAILY 01/07/21  Yes Malva Limes, MD  pantoprazole (PROTONIX) 20 MG tablet Take 20 mg by mouth daily. 02/22/22  Yes [provider]  potassium chloride SA (KLOR-CON M) 20 MEQ tablet TAKE  1 TABLET BY MOUTH ONCE A DAY 01/07/21  Yes Malva Limes, MD  valsartan (DIOVAN) 40 MG tablet TAKE 1 TABLET BY MOUTH ONCE DAILY 01/07/21  Yes Malva Limes, MD  vitamin C (ASCORBIC ACID) 500 MG tablet Take 500 mg by mouth daily. 04/16/21  Yes [provider]  ADVAIR DISKUS 100-50 MCG/ACT AEPB Inhale 1 puff into the lungs in the morning and at bedtime. Patient not taking: Reported on 07/19/2022 03/25/21   [provider]  ASPIRIN LOW DOSE 81 MG EC tablet TAKE 1 TABLET BY MOUTH  ONCE A DAY Patient not taking: Reported on 07/19/2022 01/07/21   Malva Limes, MD  mupirocin cream (BACTROBAN) 2 % Apply 1 application topically 2 (two) times daily. Patient not taking: Reported on 07/19/2022 05/30/20   Malva Limes, MD  omeprazole (PRILOSEC) 20 MG capsule Take 1 capsule by mouth 1 day or 1 dose. Patient not taking: Reported on 07/19/2022 04/01/21   [provider]    Inpatient Medications: Scheduled Meds:  Chlorhexidine Gluconate Cloth  6 each Topical Daily   fentaNYL       heparin  5,000 Units Subcutaneous Q8H   insulin aspart  0-9 Units Subcutaneous Q4H   ipratropium-albuterol  3 mL Nebulization Q6H   Continuous Infusions:  piperacillin-tazobactam (ZOSYN)  IV     PRN Meds: docusate sodium, fentaNYL, ipratropium-albuterol, polyethylene glycol  Allergies:   No Known Allergies  Social History:   Social History   Socioeconomic History   Marital status: Widowed    Spouse name: Not on file   Number of children: Not on file   Years of education: Not on file   Highest education level: Not on file  Occupational History   Not on file  Tobacco Use   Smoking status: Never   Smokeless tobacco: Never  Vaping Use   Vaping status: Never Used  Substance and Sexual Activity   Alcohol use: No   Drug use: No   Sexual activity: Not on file  Other Topics Concern   Not on file  Social History Narrative   Lives with her daughter who cares for her full time.   Social Determinants of Health   Financial Resource Strain: Not on file  Food Insecurity: Not on file  Transportation Needs: Not on file  Physical Activity: Not on file  Stress: Not on file  Social Connections: Not on file  Intimate Partner Violence: Not on file    Family History:    Family History  Problem Relation Age of Onset   Hypertension Mother    Heart failure Mother    Kidney failure Father      ROS:  Please see the history of present illness.  Review of Systems  Reason unable  to perform ROS: patient with baseline of dementia deniea any symptoms only states she is tired.  Constitutional:  Positive for malaise/fatigue.    All other ROS reviewed and negative.     Physical Exam/Data:   Vitals:   07/19/22 1115 07/19/22 1145 07/19/22 1234 07/19/22 1300  BP: (!) 127/58 114/62 (!) 164/124 (!) 152/112  Pulse: 70 68 70 70  Resp: (!) 28 (!) 36 (!) 21 (!) 28  Temp:   (!) 96.9 F (36.1 C)   TempSrc:   Axillary   SpO2: 100% 97% 97% 97%  Weight:   50 kg   Height:   4\' 9"  (1.448 m)     Intake/Output Summary (Last 24 hours) at 07/19/2022 1337 Last data  filed at 07/19/2022 1330 Gross per 24 hour  Intake 792.6 ml  Output 0 ml  Net 792.6 ml      07/19/2022   12:34 PM 06/25/2021    9:27 AM 01/12/2021    3:01 PM  Last 3 Weights  Weight (lbs) 110 lb 3.7 oz 102 lb --  Weight (kg) 50 kg 46.267 kg --     Body mass index is 23.85 kg/m.  General: Frail chronically ill-appearing HEENT: normal Neck: + JVD Vascular: No carotid bruits; Distal pulses 2+ bilaterally Cardiac:  normal S1, S2; paced rhythm; no murmur  Lungs:  Rhonchi to auscultation bilaterally, she is tachypneic, will O2 at 6 L via nasal cannula Abd: soft, nontender, no hepatomegaly  Ext: no edema Musculoskeletal:  No deformities, BUE and BLE strength normal and equal Skin: warm and dry  Neuro: Awake, alert, oriented x 1 Psych:  Normal affect   EKG:  The EKG was personally reviewed and demonstrates: Complete heart block to paced rhythm  Telemetry: Paced on the monitor with pacer turned down patient is complete heart block  Relevant CV Studies: Echocardiogram ordered and pending  Laboratory Data:  High Sensitivity Troponin:   Recent Labs  Lab 07/19/22 0850  TROPONINIHS 30*     Chemistry Recent Labs  Lab 07/19/22 0850  NA 139  K 7.0*  CL 109  CO2 12*  GLUCOSE 176*  BUN 80*  CREATININE 2.97*  CALCIUM 9.4  MG 2.9*  GFRNONAA 15*  ANIONGAP 18*    Recent Labs  Lab 07/19/22 0850  PROT  7.7  ALBUMIN 4.2  AST 193*  ALT 182*  ALKPHOS 76  BILITOT 1.6*   Lipids No results for input(s): "CHOL", "TRIG", "HDL", "LABVLDL", "LDLCALC", "CHOLHDL" in the last 168 hours.  Hematology Recent Labs  Lab 07/19/22 0850  WBC 12.9*  RBC 3.00*  HGB 9.8*  HCT 31.4*  MCV 104.7*  MCH 32.7  MCHC 31.2  RDW 19.8*  PLT 168   Thyroid  Recent Labs  Lab 07/19/22 0850  TSH 3.171    BNP Recent Labs  Lab 07/19/22 0850  BNP 3,924.5*    DDimer  Recent Labs  Lab 07/19/22 0850  DDIMER 3.30*     Radiology/Studies:  DG Chest Port 1 View  Result Date: 07/19/2022 CLINICAL DATA:  Bradyarrhythmia. EXAM: PORTABLE CHEST 1 VIEW COMPARISON:  08/29/2011 FINDINGS: Low volume film. The cardio pericardial silhouette is enlarged. Diffuse interstitial and patchy airspace disease bilaterally. No substantial pleural effusion. Lucency overlying the left lower hemithorax presumably secondary to a large hiatal hernia. Bones are diffusely demineralized. Telemetry leads overlie the chest. IMPRESSION: 1. Low volume film with diffuse interstitial and patchy airspace disease bilaterally. Imaging features could be related to edema or infection. 2. Presumed large hiatal hernia. CT imaging may prove helpful to further evaluate. Electronically Signed   By: Kennith Center M.D.   On: 07/19/2022 09:27     Assessment and Plan:   Complete heart block -Continued on transcutaneous pacer -Patient is tolerating transcutaneous pacer without discomfort -Avoid all AV nodal blocking agents -Patient previously been on metoprolol 12.5 mg daily -Recommend continued discussion with goals of care overall poor prognosis -Have spoken with the EP and with patient's advanced age and dementia they do not favor invasive procedures at this time -Continue with bedside cardiac monitor as well as defibrillator  Elevated BNP with concern for new onset decompensated heart failure -Patient denies any shortness of breath but was found to  be hypoxic requiring increased oxygen  to 6 L -BNP 3924.5 -Echocardiogram ordered and pending with further recommendations to follow -Does not appear to be volume up -Daily weights and I's and O's  Mildly elevated high-sensitivity troponin -Denies any chest discomfort -High-sensitivity troponin 30 -Continue to trend troponins -Likely demand ischemia that is multifactorial (complete heart block, hypotension, elevated BNP, hypoxia, hyperkalemia)  Acute metabolic encephalopathy/advanced dementia -Patient is alert and oriented to self only -Avoid sedative medications -With baseline of dementia hard to determine if it is her advanced dementia or encephalopathy or metabolic derangement -With likely poor prognosis continue with goals of care conversations.  Acute hypoxic respiratory failure/possible aspiration pneumonia -Maintained on oxygen therapy titrate FiO2 to maintain oxygen saturations equal to or greater than 92% -Has bronchodilator therapy ordered -She would be high risk for intubation -Continue management per PCCM  Acute kidney injury with hyperkalemia/metabolic acidosis secondary to ATN/severe lactic acidosis -Serum creatinine 2.97 -Serum potassium is 7 -Hyperkalemia treated with IV insulin and D50 in the emergency department -Will need follow-up BMP to reassess -Monitor urine output -Monitor/trend/replete electrolytes as needed -Daily BMP -Being administered sodium bicarb -Avoid nephrotoxic agents were able  Anemia -Hemoglobin 9.8 -No active signs of bleeding -Daily CBC    Risk Assessment/Risk Scores:        New York Heart Association (NYHA) Functional Class NYHA Class II        For questions or updates, please contact [facility] Please consult [email or web address] for contact info under    Signed, Osher Oettinger, NP  07/19/2022 1:37 PM

## 2022-07-19 NOTE — ED Triage Notes (Signed)
Pt bib ems from [facility] with initial call for weakness, lethargy, pale and altered this morning. Pt found to have HR of 20, 3rd degree heart block. Pt BP 68/43, arrives being paced. Pt with hx dementia, normally able to have conversation. Pt nonverbal on arrival and pale.

## 2022-07-19 NOTE — Progress Notes (Signed)
Received report from ED RN. Spoke with her about going to get ordered CT before bringing pt to unit. ED RN states unable at this time d/t pt being paced and losing capture a couple times recently on zoll. Annabelle Harman, NP made aware.

## 2022-07-19 NOTE — Progress Notes (Signed)
*  PRELIMINARY RESULTS* Echocardiogram 2D Echocardiogram has been performed.  Jasmin Roth 07/19/2022, 2:46 PM

## 2022-07-19 NOTE — Code Documentation (Signed)
Pt being paced at 70mA.

## 2022-07-19 NOTE — TOC CM/SW Note (Addendum)
Cm received messaged from Zada Girt, NP stating to give guardian of pt Jasmin Roth [phone] a call. Cm called Jasmin Roth of  [employer] a call. He stated that they need something in writing to state why pt code status needs to be changed to DNR. Cm sent message to Zada Girt. Christiane Ha requested to fax information to [place] [employer] [phone]  [phone]- Cm faxed over H&P to Legal Guardian Jasmin Roth of [facility] [employer].

## 2022-07-20 ENCOUNTER — Inpatient Hospital Stay: Payer: Medicare Other

## 2022-07-20 DIAGNOSIS — Z7189 Other specified counseling: Secondary | ICD-10-CM | POA: Diagnosis not present

## 2022-07-20 DIAGNOSIS — I442 Atrioventricular block, complete: Secondary | ICD-10-CM

## 2022-07-20 DIAGNOSIS — R001 Bradycardia, unspecified: Secondary | ICD-10-CM | POA: Diagnosis not present

## 2022-07-20 LAB — CBC
HCT: 26.8 % — ABNORMAL LOW (ref 36.0–46.0)
Hemoglobin: 8.9 g/dL — ABNORMAL LOW (ref 12.0–15.0)
MCH: 32.5 pg (ref 26.0–34.0)
MCHC: 33.2 g/dL (ref 30.0–36.0)
MCV: 97.8 fL (ref 80.0–100.0)
Platelets: 124 10*3/uL — ABNORMAL LOW (ref 150–400)
RBC: 2.74 MIL/uL — ABNORMAL LOW (ref 3.87–5.11)
RDW: 19.5 % — ABNORMAL HIGH (ref 11.5–15.5)
WBC: 18.9 10*3/uL — ABNORMAL HIGH (ref 4.0–10.5)
nRBC: 0.5 % — ABNORMAL HIGH (ref 0.0–0.2)

## 2022-07-20 LAB — COMPREHENSIVE METABOLIC PANEL
ALT: 919 U/L — ABNORMAL HIGH (ref 0–44)
AST: 767 U/L — ABNORMAL HIGH (ref 15–41)
Albumin: 3.7 g/dL (ref 3.5–5.0)
Alkaline Phosphatase: 65 U/L (ref 38–126)
Anion gap: 17 — ABNORMAL HIGH (ref 5–15)
BUN: 80 mg/dL — ABNORMAL HIGH (ref 8–23)
CO2: 21 mmol/L — ABNORMAL LOW (ref 22–32)
Calcium: 9.5 mg/dL (ref 8.9–10.3)
Chloride: 110 mmol/L (ref 98–111)
Creatinine, Ser: 2.31 mg/dL — ABNORMAL HIGH (ref 0.44–1.00)
GFR, Estimated: 20 mL/min — ABNORMAL LOW (ref >60)
Glucose, Bld: 80 mg/dL (ref 70–99)
Potassium: 5.4 mmol/L — ABNORMAL HIGH (ref 3.5–5.1)
Sodium: 148 mmol/L — ABNORMAL HIGH (ref 135–145)
Total Bilirubin: 1.9 mg/dL — ABNORMAL HIGH (ref 0.3–1.2)
Total Protein: 6.6 g/dL (ref 6.5–8.1)

## 2022-07-20 LAB — BASIC METABOLIC PANEL
Anion gap: 15 (ref 5–15)
Anion gap: 15 (ref 5–15)
BUN: 86 mg/dL — ABNORMAL HIGH (ref 8–23)
BUN: 86 mg/dL — ABNORMAL HIGH (ref 8–23)
CO2: 21 mmol/L — ABNORMAL LOW (ref 22–32)
CO2: 24 mmol/L (ref 22–32)
Calcium: 9.1 mg/dL (ref 8.9–10.3)
Calcium: 8.9 mg/dL (ref 8.9–10.3)
Chloride: 108 mmol/L (ref 98–111)
Chloride: 110 mmol/L (ref 98–111)
Creatinine, Ser: 2.64 mg/dL — ABNORMAL HIGH (ref 0.44–1.00)
Creatinine, Ser: 2.37 mg/dL — ABNORMAL HIGH (ref 0.44–1.00)
GFR, Estimated: 17 mL/min — ABNORMAL LOW (ref >60)
GFR, Estimated: 19 mL/min — ABNORMAL LOW (ref >60)
Glucose, Bld: 98 mg/dL (ref 70–99)
Glucose, Bld: 113 mg/dL — ABNORMAL HIGH (ref 70–99)
Potassium: 4.5 mmol/L (ref 3.5–5.1)
Potassium: 4.2 mmol/L (ref 3.5–5.1)
Sodium: 144 mmol/L (ref 135–145)
Sodium: 149 mmol/L — ABNORMAL HIGH (ref 135–145)

## 2022-07-20 LAB — HEPATITIS PANEL, ACUTE
HCV Ab: NONREACTIVE
Hep A IgM: NONREACTIVE
Hep B C IgM: NONREACTIVE
Hepatitis B Surface Ag: NONREACTIVE

## 2022-07-20 LAB — BLOOD GAS, ARTERIAL
Acid-Base Excess: 4.1 mmol/L — ABNORMAL HIGH (ref 0.0–2.0)
Acid-base deficit: 4.5 mmol/L — ABNORMAL HIGH (ref 0.0–2.0)
Bicarbonate: 19.2 mmol/L — ABNORMAL LOW (ref 20.0–28.0)
Bicarbonate: 27.4 mmol/L (ref 20.0–28.0)
FIO2: 0.4 %
O2 Content: 6 L/min
O2 Content: 10 L/min
O2 Saturation: 89.6 %
O2 Saturation: 99.2 %
Patient temperature: 37
Patient temperature: 37
pCO2 arterial: 31 mmHg — ABNORMAL LOW (ref 32–48)
pCO2 arterial: 36 mmHg (ref 32–48)
pH, Arterial: 7.4 (ref 7.35–7.45)
pH, Arterial: 7.49 — ABNORMAL HIGH (ref 7.35–7.45)
pO2, Arterial: 59 mmHg — ABNORMAL LOW (ref 83–108)
pO2, Arterial: 110 mmHg — ABNORMAL HIGH (ref 83–108)

## 2022-07-20 LAB — GLUCOSE, CAPILLARY
Glucose-Capillary: 100 mg/dL — ABNORMAL HIGH (ref 70–99)
Glucose-Capillary: 106 mg/dL — ABNORMAL HIGH (ref 70–99)
Glucose-Capillary: 106 mg/dL — ABNORMAL HIGH (ref 70–99)
Glucose-Capillary: 76 mg/dL (ref 70–99)
Glucose-Capillary: 78 mg/dL (ref 70–99)
Glucose-Capillary: 98 mg/dL (ref 70–99)

## 2022-07-20 LAB — MAGNESIUM: Magnesium: 2.3 mg/dL (ref 1.7–2.4)

## 2022-07-20 LAB — PHOSPHORUS: Phosphorus: 4.8 mg/dL — ABNORMAL HIGH (ref 2.5–4.6)

## 2022-07-20 LAB — LACTIC ACID, PLASMA: Lactic Acid, Venous: 1.8 mmol/L (ref 0.5–1.9)

## 2022-07-20 LAB — T3, FREE: T3, Free: 1.9 pg/mL — ABNORMAL LOW (ref 2.0–4.4)

## 2022-07-20 MED ORDER — INSULIN ASPART 100 UNIT/ML IV SOLN
10.0000 [IU] | Freq: Once | INTRAVENOUS | Status: AC
  Administered 2022-07-20: 10 [IU] via INTRAVENOUS
  Filled 2022-07-20: qty 0.1

## 2022-07-20 MED ORDER — DOPAMINE-DEXTROSE 3.2-5 MG/ML-% IV SOLN
0.0000 ug/kg/min | INTRAVENOUS | Status: DC
  Administered 2022-07-20: 5 ug/kg/min via INTRAVENOUS
  Administered 2022-07-21: 8 ug/kg/min via INTRAVENOUS
  Filled 2022-07-20 (×2): qty 250

## 2022-07-20 MED ORDER — LIDOCAINE HCL 1 % IJ SOLN
INTRAMUSCULAR | Status: AC
  Filled 2022-07-20: qty 10

## 2022-07-20 MED ORDER — DEXTROSE 50 % IV SOLN
1.0000 | Freq: Once | INTRAVENOUS | Status: AC
  Administered 2022-07-20: 50 mL via INTRAVENOUS
  Filled 2022-07-20: qty 50

## 2022-07-20 MED ORDER — SODIUM BICARBONATE 8.4 % IV SOLN
100.0000 meq | Freq: Once | INTRAVENOUS | Status: AC
  Administered 2022-07-20: 100 meq via INTRAVENOUS
  Filled 2022-07-20: qty 50

## 2022-07-20 NOTE — Procedures (Signed)
Arterial Catheter Insertion Procedure Note  Shawniece Oyola  [id number]  1933-03-27  Date:07/20/22  Time:1:02 PM    Provider Performing: Ezequiel Essex    Procedure: Insertion of Arterial Line (64332) with US guidance ([id number])   Indication(s) Blood pressure monitoring and/or need for frequent ABGs  Consent Unable to obtain consent due to emergent nature of procedure.  Anesthesia None   Time Out Verified patient identification, verified procedure, site/side was marked, verified correct patient position, special equipment/implants available, medications/allergies/relevant history reviewed, required imaging and test results available.   Sterile Technique Maximal sterile technique including full sterile barrier drape, hand hygiene, sterile gown, sterile gloves, mask, hair covering, sterile ultrasound probe cover (if used).   Procedure Description Area of catheter insertion was cleaned with chlorhexidine and draped in sterile fashion. With real-time ultrasound guidance an arterial catheter was placed into the left femoral artery.  Appropriate arterial tracings confirmed on monitor.     Complications/Tolerance None; patient tolerated the procedure well.   EBL Minimal   Specimen(s) None  Attempted to place left radial arterial line utilizing ultrasound, however unable to thread guidewire.  Therefore, successfully placed left femoral arterial line utilizing ultrasound.  Zada Girt, AGNP  Pulmonary/Critical Care Pager [phone] (please enter 7 digits) PCCM Consult Pager [phone] (please enter 7 digits)

## 2022-07-20 NOTE — TOC CM/SW Note (Signed)
Cm called the main number for [facility] to get another contact number for Legal Guardian Arva Chafe [phone]. Cm updated contact in pt demographics and informed Critical Care Team.

## 2022-07-20 NOTE — Consult Note (Signed)
PHARMACY CONSULT NOTE - FOLLOW UP  Pharmacy Consult for Electrolyte Monitoring and Replacement   Recent Labs: Potassium (mmol/L)  Date Value  07/20/2022 5.4 (H)  09/01/2011 3.8   Magnesium (mg/dL)  Date Value  16/10/9602 2.3   Calcium (mg/dL)  Date Value  54/09/8117 9.5   Calcium, Total (mg/dL)  Date Value  14/78/2956 7.8 (L)   Albumin (g/dL)  Date Value  21/30/8657 3.7  05/12/2020 4.2  08/30/2011 2.5 (L)   Phosphorus (mg/dL)  Date Value  84/69/6295 4.8 (H)   Sodium (mmol/L)  Date Value  07/20/2022 148 (H)  05/12/2020 141  09/01/2011 133 (L)     Assessment: 89 yo female who presented to [facility] ER from [facility] via EMS on 07/15 with lethargy and altered mental status.  Per ER notes EMS reported pts heart rate initially in the 20's and sbp in the 60's.  Initial EKG showed complete heart block.  EMS proceeded to transcutaneously pace pt. Pharmacy has been consulted to monitor and replace electrolytes while under PCCM care.   Diet/Nutrition:  NPO  Goal of Therapy:  Electrolytes WNL  Plan:  K: 5.4; treated with insulin and D50 by CCNP Phos: 4.8; Potential for dialysis, nephrology consulted.  Will recheck BMP this afternoon.  We will recheck all electrolytes with AM labs  Minette Brine, Student Pharmacist 07/20/2022 11:06 AM

## 2022-07-20 NOTE — Progress Notes (Signed)
NAME:  Jasmin Roth, MRN:  [id number], DOB:  [birth year], LOS: 1 ADMISSION DATE:  07/19/2022, CONSULTATION DATE: 07/19/2022 REFERRING MD: Dr. Marisa Severin, CHIEF COMPLAINT: Symptomatic Bradycardia   History of Present Illness:  This is an 89 yo female who presented to [facility] ER from [facility] via EMS on 07/15 with lethargy and altered mental status.  Per ER notes EMS reported pts heart rate initially in the 20's and sbp in the 60's.  Initial EKG showed complete heart block.  EMS proceeded to transcutaneously pace pt.  ED Course  Upon arrival to the ER while transitioning pt to zoll monitor pts heart rate went down to the 30's.  Transcutaneous pacing reestablished with a capture at 70 mA and rate of 80.  Pt also noted to be hypoxic with O2 sats in the 80's on 2L O2 via nasal canula, therefore O2 increased to 6L.  Significant lab results were: K+ 7.0/CO2 12/glucose 176/BUN 80/creatinine 2.97/anion gap 18/AST 193/ALT 182/BNP 3,924.5/troponin 30/lactic acid >9.0/wbc 12.9/hgb 9.8/COVID negative.  CXR concerning for pulmonary edema vs pneumonia.   Pt received 1g of calcium gluconate/cefepime/10 units of iv novolog/1 amp of D50W/1 amp of sodium bicarb/500 ml NS bolus/vancomycin.  Cardiology consulted per EDP for possible need for transvenous pacemaker placement. PCCM team contacted for ICU admission.      Pertinent  Medical History  Dementia  HTN   Significant Hospital Events: Including procedures, antibiotic start and stop dates in addition to other pertinent events   07/15: Pt admitted to ICU with complete heart block, acute hypoxic respiratory failure secondary to decompensated CHF vs. possible; acute kidney injury with hyperkalemia and metabolic acidosis, and severe lactic acidosis requiring transcutaneous pacing  07/20/22- patient is slightly more stable overnight.  She still is overall very sick with numerous severe comorbid conditions.  At this time I feel patient has very poor prognosis and  recommend hospice.   Micro Data:   07/15: COVID>>negative  07/15: Blood x2>> 07/15: MRSA PCR>>  Anti-infectives (From admission, onward)    Start     Dose/Rate Route Frequency Ordered Stop   07/19/22 1400  piperacillin-tazobactam (ZOSYN) IVPB 2.25 g        2.25 g 100 mL/hr over 30 Minutes Intravenous Every 8 hours 07/19/22 1242     07/19/22 1000  ceFEPIme (MAXIPIME) 2 g in sodium chloride 0.9 % 100 mL IVPB        2 g 200 mL/hr over 30 Minutes Intravenous  Once 07/19/22 0951 07/19/22 1112   07/19/22 1000  vancomycin (VANCOCIN) IVPB 1000 mg/200 mL premix        1,000 mg 200 mL/hr over 60 Minutes Intravenous  Once 07/19/22 0951 07/19/22 1122        Objective   Blood pressure (!) 137/40, pulse (!) 35, temperature 98 F (36.7 C), temperature source Oral, resp. rate (!) 30, height 4\' 9"  (1.448 m), weight 50.3 kg, SpO2 96%.        Intake/Output Summary (Last 24 hours) at 07/20/2022 1018 Last data filed at 07/20/2022 4098 Gross per 24 hour  Intake 892.6 ml  Output 175 ml  Net 717.6 ml   Filed Weights   07/19/22 1234 07/20/22 0700  Weight: 50 kg 50.3 kg    Examination: General: Acute on chronically-ill appearing female, in respiratory distress on 6L O2 via nasal canula   HENT: JVD present  Lungs: Rhonchi throughout, tachypneic with accessory muscle use  Cardiovascular: Paced rhythm, no r/g, 2+ radial/2+ distal pulses, no edema  Abdomen:  Hypoactive BS x4, obese, soft, non distended, non tender Extremities: Normal bulk and tone, moves all extremities  Neuro: Awake but confused, following commands, PERRLA  GU: Deferred   Resolved Hospital Problem list    Assessment & Plan:   #Acute metabolic encephalopathy  #Dementia appears advanced  - Correct metabolic derangements  - Avoid sedating medications when able - Frequent reorientation   #Acute hypoxic respiratory failure secondary to decompensated CHF and possible pneumonia  - Supplemental O2 for dyspnea and/or hypoxia   - Maintain O2 sats 92% or higher  - Scheduled and prn bronchodilator therapy  - Intermittent CXR and ABG's  - HIGH RISK FOR INTUBATION - recommendation to de-escalate and transition to DNR hospice.   #Complete heart block  #Cardiogenic and possible septic shock  #New onset decompensated CHF #Mildly elevated troponin suspect secondary to demand ischemia  Hx: HTN  - Continuous telemetry monitoring  - Trend troponin's until peaked  - TSH, free T3, and cortisol level results pending  - Cardiology consulted appreciate input: recommended continued goals of care conversations due to overall poor prognosis  - Hold outpatient antihypertensives for now  - Avoid beta blocker therapy  - Echo pending  - Prn transcutaneous pacing for goal heart rate of at least 70 bpm   #Acute kidney injury with hyperkalemia and severe metabolic acidosis secondary to ATN  #Severe lactic acidosis  - Trend BMP, lactic acid, and vbg  - Replace electrolytes as indicated  - Monitor UOP  - Will administer 150 meq sodium bicarb   #Transaminitis secondary to hypotension  - Trend hepatic function panel  - Avoid hepatotoxic medications  - CT Abd/Pelvis pending   #Possible pneumonia suspect aspiration in setting of large hiatal hernia  - Trend WBC and monitor fever curve  - Trend PCT  - Follow cultures  - Continue abx as outlined above  #Anemia without obvious acute blood loss  - Trend CBC  - Monitor for s/sx of bleeding  - Transfuse for hgb <7 - Vitamin B12 and folic acid levels pending  #Hyperglycemia  - CBG's q4hrs  - SSI  - Target CBG reading: 140 to 180 - Hemoglobin A1c pending   Best Practice (right click and "Reselect all SmartList Selections" daily)   Diet/type: NPO DVT prophylaxis: prophylactic heparin  GI prophylaxis: N/A Lines: N/A Foley:  N/A Code Status:  full code Last date of multidisciplinary goals of care discussion [N/A]   Labs   CBC: Recent Labs  Lab 07/19/22 0850  07/20/22 0301  WBC 12.9* 18.9*  NEUTROABS 6.9  --   HGB 9.8* 8.9*  HCT 31.4* 26.8*  MCV 104.7* 97.8  PLT 168 124*    Basic Metabolic Panel: Recent Labs  Lab 07/19/22 0850 07/19/22 1354 07/19/22 1745 07/20/22 0301  NA 139 141 144 148*  K 7.0* 4.9 5.1 5.4*  CL 109 108 107 110  CO2 12* 18* 23 21*  GLUCOSE 176* 96 100* 80  BUN 80* 74* 75* 80*  CREATININE 2.97* 2.43* 2.32* 2.31*  CALCIUM 9.4 9.2 9.4 9.5  MG 2.9*  --   --  2.3  PHOS 7.9*  --   --  4.8*   GFR: Estimated Creatinine Clearance: 11.9 mL/min (A) (by C-G formula based on SCr of 2.31 mg/dL (H)). Recent Labs  Lab 07/19/22 0850 07/19/22 0904 07/19/22 1354 07/19/22 1745 07/20/22 0301  PROCALCITON <0.10  --   --   --   --   WBC 12.9*  --   --   --  18.9*  LATICACIDVEN >9.0* >9.0* 5.5* 4.3*  --     Liver Function Tests: Recent Labs  Lab 07/19/22 0850 07/20/22 0301  AST 193* 767*  ALT 182* 919*  ALKPHOS 76 65  BILITOT 1.6* 1.9*  PROT 7.7 6.6  ALBUMIN 4.2 3.7   No results for input(s): "LIPASE", "AMYLASE" in the last 168 hours. No results for input(s): "AMMONIA" in the last 168 hours.  ABG    Component Value Date/Time   PHART 7.4 07/20/2022 0820   PCO2ART 31 (L) 07/20/2022 0820   PO2ART 59 (L) 07/20/2022 0820   HCO3 19.2 (L) 07/20/2022 0820   ACIDBASEDEF 4.5 (H) 07/20/2022 0820   O2SAT 89.6 07/20/2022 0820     Coagulation Profile: Recent Labs  Lab 07/19/22 1354  INR 1.8*    Cardiac Enzymes: No results for input(s): "CKTOTAL", "CKMB", "CKMBINDEX", "TROPONINI" in the last 168 hours.  HbA1C: Hgb A1c MFr Bld  Date/Time Value Ref Range Status  07/19/2022 01:54 PM 4.7 (L) 4.8 - 5.6 % Final    Comment:    (NOTE) Pre diabetes:          5.7%-6.4%  Diabetes:              >6.4%  Glycemic control for   <7.0% adults with diabetes   10/16/2019 02:27 PM 4.7 (L) 4.8 - 5.6 % Final    Comment:             Prediabetes: 5.7 - 6.4          Diabetes: >6.4          Glycemic control for adults with  diabetes: <7.0     CBG: Recent Labs  Lab 07/19/22 1623 07/19/22 1920 07/19/22 2318 07/20/22 0324 07/20/22 0735  GLUCAP 91 86 82 76 78    Review of Systems:   Unable to assess pt confused and has dementia   Past Medical History:  She,  has a past medical history of Hypertension.   Surgical History:   Past Surgical History:  Procedure Laterality Date   CESAREAN SECTION     ELBOW LIGAMENT RECONSTRUCTION     TONSILLECTOMY AND ADENOIDECTOMY       Social History:   reports that she has never smoked. She has never used smokeless tobacco. She reports that she does not drink alcohol and does not use drugs.   Family History:  Her family history includes Heart failure in her mother; Hypertension in her mother; Kidney failure in her father.   Allergies No Known Allergies   Home Medications  Prior to Admission medications   Medication Sig Start Date End Date Taking? Authorizing Provider  ADVAIR DISKUS 100-50 MCG/ACT AEPB Inhale 1 puff into the lungs in the morning and at bedtime. 03/25/21   [provider]  amLODipine (NORVASC) 5 MG tablet TAKE 1 TABLET BY MOUTH DAILY 01/07/21   Malva Limes, MD  ASPIRIN LOW DOSE 81 MG EC tablet TAKE 1 TABLET BY MOUTH ONCE A DAY 01/07/21   Malva Limes, MD  ferrous sulfate 325 (65 FE) MG EC tablet Take 325 mg by mouth daily.     [provider]  hydrochlorothiazide (HYDRODIURIL) 12.5 MG tablet Take 1 tablet by mouth daily. 07/23/21   [provider]  metoprolol tartrate (LOPRESSOR) 25 MG tablet TAKE 1/2 TABLET BY MOUTH DAILY 01/07/21   Malva Limes, MD  mupirocin cream (BACTROBAN) 2 % Apply 1 application topically 2 (two) times daily. 05/30/20   Malva Limes, MD  omeprazole (PRILOSEC) 20 MG capsule Take 1 capsule by mouth 1 day or 1 dose. 04/01/21   [provider]  potassium chloride SA (KLOR-CON M) 20 MEQ tablet TAKE 1 TABLET BY MOUTH ONCE A DAY 01/07/21   Malva Limes, MD  valsartan (DIOVAN) 40 MG  tablet TAKE 1 TABLET BY MOUTH ONCE DAILY 01/07/21   Malva Limes, MD  vitamin C (ASCORBIC ACID) 500 MG tablet Take 500 mg by mouth daily. 04/16/21   [provider]     Critical care provider statement:   Total critical care time: 33 minutes   Performed by: Karna Christmas MD   Critical care time was exclusive of separately billable procedures and treating other patients.   Critical care was necessary to treat or prevent imminent or life-threatening deterioration.   Critical care was time spent personally by me on the following activities: development of treatment plan with patient and/or surrogate as well as nursing, discussions with consultants, evaluation of patient's response to treatment, examination of patient, obtaining history from patient or surrogate, ordering and performing treatments and interventions, ordering and review of laboratory studies, ordering and review of radiographic studies, pulse oximetry and re-evaluation of patient's condition.    Vida Rigger, M.D.  Pulmonary & Critical Care Medicine

## 2022-07-20 NOTE — Progress Notes (Signed)
SLP Cancellation Note  Patient Details Name: Jasmin Roth MRN: [id number] DOB: 09/03/1933   Cancelled treatment:       Reason Eval/Treat Not Completed: Medical issues which prohibited therapy (chart reviewed; spoke w/ NSG)  Discussed pt's status w/ NSG this morning. NSG requested Holding on BSE this morning as Team attempts to make contact w/ a Guardian for GOC discussion d/t pt's declined medical status.  ST services will Hold on BSE at this time and f/u this afternoon. Recommend frequent oral care for hygiene and stimulation of swallowing as able; pt has a Baseline of Dementia. NSG agreed.       Jerilynn Som, MS, CCC-SLP Speech Language Pathologist Rehab Services; [facility] [phone] (ascom) Cayne Yom 07/20/2022, 8:26 AM

## 2022-07-20 NOTE — Progress Notes (Signed)
Progress Note  Patient Name: Jasmin Roth Date of Encounter: 07/20/2022  Primary Cardiologist: New - consult by Kirke Corin  Subjective   She denies chest pain, dyspnea, palpitations, dizziness, presyncope, or syncope.  Remains on external pacer, not capturing.  Oxygen saturation remains stable with pausing of external pacing.  Critical care attempting to get in touch with legal guardian for further discussion of goals of care (ward of state).  Inpatient Medications    Scheduled Meds:  Chlorhexidine Gluconate Cloth  6 each Topical Daily   heparin  5,000 Units Subcutaneous Q8H   insulin aspart  0-9 Units Subcutaneous Q4H   mouth rinse  15 mL Mouth Rinse 4 times per day   Continuous Infusions:  piperacillin-tazobactam (ZOSYN)  IV 2.25 g (07/20/22 0648)   PRN Meds: docusate sodium, ipratropium-albuterol, mouth rinse, polyethylene glycol   Vital Signs    Vitals:   07/20/22 0615 07/20/22 0645 07/20/22 0700 07/20/22 0738  BP: (!) 120/34 (!) 89/44    Pulse: 64 (!) 35  (!) 32  Resp: (!) 34 (!) 35  (!) 37  Temp:    98 F (36.7 C)  TempSrc:    Oral  SpO2: 91% (!) 89%  91%  Weight:   50.3 kg   Height:        Intake/Output Summary (Last 24 hours) at 07/20/2022 0829 Last data filed at 07/20/2022 0648 Gross per 24 hour  Intake 892.6 ml  Output 175 ml  Net 717.6 ml   Filed Weights   07/19/22 1234 07/20/22 0700  Weight: 50 kg 50.3 kg    Telemetry    Based, not capturing, with underlying complete heart block with good junctional escape with rates in the 30s to 40s bpm - Personally Reviewed  ECG    No new tracings - Personally Reviewed  Physical Exam   GEN: No acute distress.   Neck: No JVD. Cardiac: Bradycardic, no murmurs, rubs, or gallops.  Respiratory: Diminished breath sounds bilaterally with faint expiratory wheezing.  GI: Soft, nontender, non-distended.   MS: No edema; No deformity. Neuro:  Alert; Nonfocal.  Psych: Normal affect.  Labs    Chemistry Recent  Labs  Lab 07/19/22 0850 07/19/22 1354 07/19/22 1745 07/20/22 0301  NA 139 141 144 148*  K 7.0* 4.9 5.1 5.4*  CL 109 108 107 110  CO2 12* 18* 23 21*  GLUCOSE 176* 96 100* 80  BUN 80* 74* 75* 80*  CREATININE 2.97* 2.43* 2.32* 2.31*  CALCIUM 9.4 9.2 9.4 9.5  PROT 7.7  --   --  6.6  ALBUMIN 4.2  --   --  3.7  AST 193*  --   --  767*  ALT 182*  --   --  919*  ALKPHOS 76  --   --  65  BILITOT 1.6*  --   --  1.9*  GFRNONAA 15* 19* 20* 20*  ANIONGAP 18* 15 14 17*     Hematology Recent Labs  Lab 07/19/22 0850 07/20/22 0301  WBC 12.9* 18.9*  RBC 3.00* 2.74*  HGB 9.8* 8.9*  HCT 31.4* 26.8*  MCV 104.7* 97.8  MCH 32.7 32.5  MCHC 31.2 33.2  RDW 19.8* 19.5*  PLT 168 124*    Cardiac EnzymesNo results for input(s): "TROPONINI" in the last 168 hours. No results for input(s): "TROPIPOC" in the last 168 hours.   BNP Recent Labs  Lab 07/19/22 0850  BNP 3,924.5*     DDimer  Recent Labs  Lab 07/19/22 [phone]  DDIMER 3.30*     Radiology    US RENAL  Result Date: 07/19/2022 IMPRESSION: Negative for hydronephrosis. Electronically Signed   By: Jasmine Pang M.D.   On: 07/19/2022 20:40   DG Chest Port 1 View  Result Date: 07/19/2022 IMPRESSION: 1. Low volume film with diffuse interstitial and patchy airspace disease bilaterally. Imaging features could be related to edema or infection. 2. Presumed large hiatal hernia. CT imaging may prove helpful to further evaluate. Electronically Signed   By: Kennith Center M.D.   On: 07/19/2022 09:27    Cardiac Studies   2D echo 07/19/2022: 1. Left ventricular ejection fraction, by estimation, is 60 to 65%. The  left ventricle has normal function. The left ventricle has no regional  wall motion abnormalities. There is mild left ventricular hypertrophy.  Left ventricular diastolic parameters  are indeterminate.   2. Right ventricular systolic function is normal. The right ventricular  size is normal. There is normal pulmonary artery  systolic pressure.   3. Left atrial size was moderately dilated.   4. The mitral valve is abnormal. No evidence of mitral valve  regurgitation. No evidence of mitral stenosis. Severe mitral annular  calcification.   5. Tricuspid valve regurgitation is mild to moderate.   6. The aortic valve is normal in structure. Aortic valve regurgitation is  not visualized. Aortic valve sclerosis/calcification is present, without  any evidence of aortic stenosis.   Patient Profile     89 y.o. female with history of dementia and HTN who we are evaluating for high-grade AV block.  Assessment & Plan    1.  High-grade AV block: -Previously on metoprolol 12.5 mg daily, held -Transcutaneous pacing not capturing with underlying rhythm of complete heart block with good junctional escape -Critical care to place arterial line to assess blood pressure with underlying high-grade AV block to ensure venous temp wire is not indicated at this time while goals of care are assessed with the state -Unlikely to be a candidate for pacemaker implantation given dementia, multiorgan failure, and advanced age -Avoid AV nodal blocking medications -TSH normal  2.  Elevated high-sensitivity troponin: -Mildly elevated and flat trending at 44 -Not consistent with ACS -Suspect this is secondary to supply/demand ischemia with underlying high-grade AV block -Echo with preserved LV systolic function and normal wall motion -Not a candidate for invasive ischemic testing with underlying dementia  3.  Elevated BNP: -Does not appear significantly volume overloaded, normal RVSP on echo this admission -No indication for diuresis at this time  4.  Hyperkalemia: -Improved, though mildly elevated at 5.4 currently -Despite improvement in potassium level, high-grade AV block has persisted  Remaining comorbidities per primary service       For questions or updates, please contact [facility] Please consult [email or web address] for  contact info under Cardiology/STEMI.    Signed, Eula Listen, PA-C [facility] Pager: [phone] 07/20/2022, 8:29 AM

## 2022-07-20 NOTE — Consult Note (Addendum)
Consultation Note Date: 07/20/2022   Patient Name: Jasmin Roth  DOB: [birth year]  MRN: [id number]  Age / Sex: 89 y.o., female  PCP: Malva Limes, MD Referring Physician: Vida Rigger, MD  Reason for Consultation: Establishing goals of care  HPI/Patient Profile: This is an 89 yo female with a history of dementia who presented to [facility] ER from [facility] via EMS on 07/15 with lethargy and altered mental status.  Per ER notes EMS reported pts heart rate initially in the 20's and sbp in the 60's.  Initial EKG showed complete heart block.  EMS proceeded to transcutaneously pace pt. Cardiology consulted per EDP for possible need for transvenous pacemaker placement. PCCM team contacted for ICU admission.    Clinical Assessment and Goals of Care: Labs and notes reviewed in detail.  Patient has baseline dementia.  [facility] DSS is her legal guardian. Detailed conversation with CCM regarding patient.  Called to speak with DSS case manager Christiane Ha.  Christiane Ha unsure about details of DSS engagement.  He states he saw the patient in May and she was "pleasantly confused".   We discussed her diagnoses and possible care decisions, and discussed determining patient's GOC, EOL wishes disposition and options.  Christiane Ha states he has forwarded forms to his team for a DNR status.  Discussed life-prolonging care vs comfort focused care.  Discussed consideration of what patient would feel to be an acceptable quality of life.  He states he will speak with his supervisor and team about care moving forward.  CCM was updated on conversation.   SUMMARY OF RECOMMENDATIONS   DSS CM to speak with his team regarding care moving forward.  Contact info for ICU and PMT provided.  Prognosis:  Poor       Primary Diagnoses: Present on Admission:  Symptomatic bradycardia   I have reviewed the medical record,  interviewed the patient and family, and examined the patient. The following aspects are pertinent.  Past Medical History:  Diagnosis Date   Hypertension    Social History   Socioeconomic History   Marital status: Widowed    Spouse name: Not on file   Number of children: Not on file   Years of education: Not on file   Highest education level: Not on file  Occupational History   Not on file  Tobacco Use   Smoking status: Never   Smokeless tobacco: Never  Vaping Use   Vaping status: Never Used  Substance and Sexual Activity   Alcohol use: No   Drug use: No   Sexual activity: Not on file  Other Topics Concern   Not on file  Social History Narrative   Lives with her daughter who cares for her full time.   Social Determinants of Health   Financial Resource Strain: Not on file  Food Insecurity: Not on file  Transportation Needs: Not on file  Physical Activity: Not on file  Stress: Not on file  Social Connections: Not on file   Family History  Problem Relation Age of  Onset   Hypertension Mother    Heart failure Mother    Kidney failure Father    Scheduled Meds:  Chlorhexidine Gluconate Cloth  6 each Topical Daily   heparin  5,000 Units Subcutaneous Q8H   insulin aspart  0-9 Units Subcutaneous Q4H   mouth rinse  15 mL Mouth Rinse 4 times per day   Continuous Infusions:  piperacillin-tazobactam (ZOSYN)  IV 2.25 g (07/20/22 0648)   PRN Meds:.docusate sodium, ipratropium-albuterol, mouth rinse, polyethylene glycol Medications Prior to Admission:  Prior to Admission medications   Medication Sig Start Date End Date Taking? Authorizing Provider  amLODipine (NORVASC) 5 MG tablet TAKE 1 TABLET BY MOUTH DAILY 01/07/21  Yes Malva Limes, MD  ferrous sulfate 325 (65 FE) MG EC tablet Take 325 mg by mouth daily.    Yes [provider]  hydrochlorothiazide (HYDRODIURIL) 12.5 MG tablet Take 1 tablet by mouth daily. 07/23/21  Yes [provider]  hydrOXYzine  (ATARAX) 10 MG tablet Take 10 mg by mouth every 8 (eight) hours as needed for anxiety.   Yes [provider]  metoprolol tartrate (LOPRESSOR) 25 MG tablet TAKE 1/2 TABLET BY MOUTH DAILY 01/07/21  Yes Malva Limes, MD  pantoprazole (PROTONIX) 20 MG tablet Take 20 mg by mouth daily. 02/22/22  Yes [provider]  potassium chloride SA (KLOR-CON M) 20 MEQ tablet TAKE 1 TABLET BY MOUTH ONCE A DAY 01/07/21  Yes Malva Limes, MD  valsartan (DIOVAN) 40 MG tablet TAKE 1 TABLET BY MOUTH ONCE DAILY 01/07/21  Yes Malva Limes, MD  vitamin C (ASCORBIC ACID) 500 MG tablet Take 500 mg by mouth daily. 04/16/21  Yes [provider]  ADVAIR DISKUS 100-50 MCG/ACT AEPB Inhale 1 puff into the lungs in the morning and at bedtime. Patient not taking: Reported on 07/19/2022 03/25/21   [provider]  ASPIRIN LOW DOSE 81 MG EC tablet TAKE 1 TABLET BY MOUTH ONCE A DAY Patient not taking: Reported on 07/19/2022 01/07/21   Malva Limes, MD  mupirocin cream (BACTROBAN) 2 % Apply 1 application topically 2 (two) times daily. Patient not taking: Reported on 07/19/2022 05/30/20   Malva Limes, MD  omeprazole (PRILOSEC) 20 MG capsule Take 1 capsule by mouth 1 day or 1 dose. Patient not taking: Reported on 07/19/2022 04/01/21   [provider]   No Known Allergies Review of Systems  Unable to perform ROS   Physical Exam Pulmonary:     Effort: Pulmonary effort is normal.  Neurological:     Mental Status: She is alert.     Vital Signs: BP (!) 135/40   Pulse (!) 36   Temp (!) 97.4 F (36.3 C) (Oral)   Resp (!) 30   Ht 4\' 9"  (1.448 m)   Wt 50.3 kg   SpO2 93%   BMI 24.00 kg/m  Pain Scale: PAINAD   Pain Score: 0-No pain   SpO2: SpO2: 93 % O2 Device:SpO2: 93 % O2 Flow Rate: .O2 Flow Rate (L/min): 10 L/min  IO: Intake/output summary:  Intake/Output Summary (Last 24 hours) at 07/20/2022 1142 Last data filed at 07/20/2022 1032 Gross per 24 hour  Intake 892.6 ml   Output 200 ml  Net 692.6 ml    LBM: Last BM Date : 07/20/22 Baseline Weight: Weight: 50 kg Most recent weight: Weight: 50.3 kg      Signed by: Morton Stall, NP   Please contact Palliative Medicine Team phone at [phone] for questions and  concerns.  For individual provider: See Loretha Stapler

## 2022-07-21 DIAGNOSIS — N179 Acute kidney failure, unspecified: Secondary | ICD-10-CM

## 2022-07-21 DIAGNOSIS — I442 Atrioventricular block, complete: Secondary | ICD-10-CM | POA: Diagnosis not present

## 2022-07-21 DIAGNOSIS — R001 Bradycardia, unspecified: Secondary | ICD-10-CM | POA: Diagnosis not present

## 2022-07-21 DIAGNOSIS — Z7189 Other specified counseling: Secondary | ICD-10-CM | POA: Diagnosis not present

## 2022-07-21 LAB — CBC WITH DIFFERENTIAL/PLATELET
Abs Immature Granulocytes: 0.77 10*3/uL — ABNORMAL HIGH (ref 0.00–0.07)
Basophils Absolute: 0 10*3/uL (ref 0.0–0.1)
Basophils Relative: 0 %
Eosinophils Absolute: 0 10*3/uL (ref 0.0–0.5)
Eosinophils Relative: 0 %
HCT: 28.7 % — ABNORMAL LOW (ref 36.0–46.0)
Hemoglobin: 9.3 g/dL — ABNORMAL LOW (ref 12.0–15.0)
Immature Granulocytes: 4 %
Lymphocytes Relative: 3 %
Lymphs Abs: 0.6 10*3/uL — ABNORMAL LOW (ref 0.7–4.0)
MCH: 32.3 pg (ref 26.0–34.0)
MCHC: 32.4 g/dL (ref 30.0–36.0)
MCV: 99.7 fL (ref 80.0–100.0)
Monocytes Absolute: 8.6 10*3/uL — ABNORMAL HIGH (ref 0.1–1.0)
Monocytes Relative: 45 %
Neutro Abs: 9 10*3/uL — ABNORMAL HIGH (ref 1.7–7.7)
Neutrophils Relative %: 48 %
Platelets: 111 10*3/uL — ABNORMAL LOW (ref 150–400)
RBC: 2.88 MIL/uL — ABNORMAL LOW (ref 3.87–5.11)
RDW: 19.9 % — ABNORMAL HIGH (ref 11.5–15.5)
Smear Review: NORMAL
WBC: 19 10*3/uL — ABNORMAL HIGH (ref 4.0–10.5)
nRBC: 0.5 % — ABNORMAL HIGH (ref 0.0–0.2)

## 2022-07-21 LAB — COMPREHENSIVE METABOLIC PANEL
ALT: 1492 U/L — ABNORMAL HIGH (ref 0–44)
AST: 876 U/L — ABNORMAL HIGH (ref 15–41)
Albumin: 3.5 g/dL (ref 3.5–5.0)
Alkaline Phosphatase: 75 U/L (ref 38–126)
Anion gap: 12 (ref 5–15)
BUN: 91 mg/dL — ABNORMAL HIGH (ref 8–23)
CO2: 26 mmol/L (ref 22–32)
Calcium: 8.7 mg/dL — ABNORMAL LOW (ref 8.9–10.3)
Chloride: 112 mmol/L — ABNORMAL HIGH (ref 98–111)
Creatinine, Ser: 2.14 mg/dL — ABNORMAL HIGH (ref 0.44–1.00)
GFR, Estimated: 22 mL/min — ABNORMAL LOW (ref >60)
Glucose, Bld: 122 mg/dL — ABNORMAL HIGH (ref 70–99)
Potassium: 3.7 mmol/L (ref 3.5–5.1)
Sodium: 150 mmol/L — ABNORMAL HIGH (ref 135–145)
Total Bilirubin: 2.3 mg/dL — ABNORMAL HIGH (ref 0.3–1.2)
Total Protein: 6.2 g/dL — ABNORMAL LOW (ref 6.5–8.1)

## 2022-07-21 LAB — PHOSPHORUS: Phosphorus: 5.5 mg/dL — ABNORMAL HIGH (ref 2.5–4.6)

## 2022-07-21 LAB — GLUCOSE, CAPILLARY
Glucose-Capillary: 109 mg/dL — ABNORMAL HIGH (ref 70–99)
Glucose-Capillary: 109 mg/dL — ABNORMAL HIGH (ref 70–99)
Glucose-Capillary: 112 mg/dL — ABNORMAL HIGH (ref 70–99)
Glucose-Capillary: 116 mg/dL — ABNORMAL HIGH (ref 70–99)
Glucose-Capillary: 129 mg/dL — ABNORMAL HIGH (ref 70–99)
Glucose-Capillary: 99 mg/dL (ref 70–99)

## 2022-07-21 LAB — MAGNESIUM: Magnesium: 2.5 mg/dL — ABNORMAL HIGH (ref 1.7–2.4)

## 2022-07-21 MED ORDER — LACTATED RINGERS IV BOLUS
500.0000 mL | Freq: Once | INTRAVENOUS | Status: AC
  Administered 2022-07-21: 500 mL via INTRAVENOUS

## 2022-07-21 MED ORDER — HYDROMORPHONE HCL 1 MG/ML IJ SOLN: 0.5000 mg | INTRAMUSCULAR | Status: DC | PRN

## 2022-07-21 MED ORDER — LACTATED RINGERS IV SOLN: INTRAVENOUS | Status: DC

## 2022-07-21 MED ORDER — DEXTROSE IN LACTATED RINGERS 5 % IV SOLN: INTRAVENOUS | Status: DC

## 2022-07-21 NOTE — Consult Note (Signed)
PHARMACY CONSULT NOTE - FOLLOW UP  Pharmacy Consult for Electrolyte Monitoring and Replacement   Recent Labs: Potassium (mmol/L)  Date Value  07/21/2022 3.7  09/01/2011 3.8   Magnesium (mg/dL)  Date Value  30/86/5784 2.5 (H)   Calcium (mg/dL)  Date Value  69/62/9528 8.7 (L)   Calcium, Total (mg/dL)  Date Value  41/32/4401 7.8 (L)   Albumin (g/dL)  Date Value  02/72/5366 3.5  05/12/2020 4.2  08/30/2011 2.5 (L)   Phosphorus (mg/dL)  Date Value  44/03/4740 5.5 (H)   Sodium (mmol/L)  Date Value  07/21/2022 150 (H)  05/12/2020 141  09/01/2011 133 (L)     Assessment: 89 yo female who presented to [facility] ER from [facility] via EMS on 07/15 with lethargy and altered mental status.  Per ER notes EMS reported pts heart rate initially in the 20's and sbp in the 60's.  Initial EKG showed complete heart block.  EMS proceeded to transcutaneously pace pt. Pharmacy has been consulted to monitor and replace electrolytes while under PCCM care.   Diet/Nutrition:  NPO  Goal of Therapy:  Electrolytes WNL  Plan:  No replacement currently indicated We will recheck all electrolytes with AM labs  Bettey Costa, PharmD Clinical Pharmacist 07/21/2022 1:19 PM

## 2022-07-21 NOTE — Progress Notes (Addendum)
Daily Progress Note   Patient Name: Jasmin Roth       Date: 07/21/2022 DOB: 1933/06/23  Age: 89 y.o. MRN#: [id number] Attending Physician: Vida Rigger, MD Primary Care Physician: Malva Limes, MD Admit Date: 07/19/2022  Reason for Consultation/Follow-up: Establishing goals of care  Subjective: Notes and labs reviewed.  Patient is currently working with nursing staff at this time.  No distress noted.  Spoke with CCM in detail for updates.  We discussed patient's status and very poor prognosis.  CCM has been able to speak with DSS and are in conversations regarding a 2 physician order for DNR and/or comfort care.  As per conversation, PMT will shadow at this time.   Length of Stay: 2  Current Medications: Scheduled Meds:   Chlorhexidine Gluconate Cloth  6 each Topical Daily   heparin  5,000 Units Subcutaneous Q8H   insulin aspart  0-9 Units Subcutaneous Q4H   mouth rinse  15 mL Mouth Rinse 4 times per day    Continuous Infusions:  dextrose 5% lactated ringers Stopped (07/21/22 0546)   DOPamine 10 mcg/kg/min (07/21/22 0700)   piperacillin-tazobactam (ZOSYN)  IV Stopped (07/21/22 0616)    PRN Meds: docusate sodium, ipratropium-albuterol, mouth rinse, polyethylene glycol  Physical Exam Pulmonary:     Effort: Pulmonary effort is normal.  Neurological:     Mental Status: She is alert.             Vital Signs: BP (!) 124/55   Pulse (!) 30   Temp 97.7 F (36.5 C) (Oral)   Resp 19   Ht 4\' 9"  (1.448 m)   Wt 51.5 kg   SpO2 100%   BMI 24.57 kg/m  SpO2: SpO2: 100 % O2 Device: O2 Device: High Flow Nasal Cannula O2 Flow Rate: O2 Flow Rate (L/min): 7 L/min  Intake/output summary:  Intake/Output Summary (Last 24 hours) at 07/21/2022 1142 Last data filed at 07/21/2022  0703 Gross per 24 hour  Intake 854.67 ml  Output 325 ml  Net 529.67 ml   LBM: Last BM Date :  (PTA) Baseline Weight: Weight: 50 kg Most recent weight: Weight: 51.5 kg   Patient Active Problem List   Diagnosis Date Noted   Heart block AV third degree (HCC) 07/20/2022   Symptomatic bradycardia 07/19/2022   Vitamin D deficiency 07/17/2019  Hyperlipidemia 05/27/2015   Osteoporosis, post-menopausal 05/27/2015   Arthritis 05/27/2015   Acid reflux 11/19/2014   Body mass index (BMI) of 26.0-26.9 in adult 11/19/2014   Gait instability 11/19/2014   Blood in the urine 11/19/2014   Calcium blood increased 11/19/2014   Hypokalemia 11/19/2014   Snores 11/19/2014   Hypertension 08/01/2014   Scoliosis 06/17/2014   Iron deficiency anemia, unspecified 08/06/2009   Absence of bladder continence 05/27/2008   Abnormal loss of weight 01/08/2008   Non-toxic uninodular goiter 12/16/2005   Anxiety 02/11/2003    Palliative Care Assessment & Plan     Recommendations/Plan: CCM is working with DSS regarding 2 physician order for DNR and/or comfort measures. PMT will shadow If patient is transitioned to comfort care, could consider hospice facility placement if patient does not have a hospital death.  Code Status:    Code Status Orders  (From admission, onward)           Start     Ordered   07/19/22 1125  Full code  Continuous       Question:  By:  Answer:  Consent: discussion documented in EHR   07/19/22 1126           Code Status History     This patient has a current code status but no historical code status.       Prognosis: Poor    Care plan was discussed with CCM NP  Thank you for allowing the Palliative Medicine Team to assist in the care of this patient.    Morton Stall, NP  Please contact Palliative Medicine Team phone at [phone] for questions and concerns.

## 2022-07-21 NOTE — Progress Notes (Signed)
[facility]- Civil engineer, contracting [facility])    Received request from Transitions of Care Manger for Hospice Home evaluaton.  Eligibility is pending.   Spoke with Lenn Sink, [facility] and guardian of patient, to confirm interest and explain services.  She would like for HL to evaluate further tomorrow to see how patient does this evening.    Please do not hesitate to call if you have any Hospice related questions or concerns.    Redge Gainer,  [facility] Liaison [phone]

## 2022-07-21 NOTE — Evaluation (Signed)
Clinical/Bedside Swallow Evaluation Patient Details  Name: Jasmin Roth MRN: [id number] Date of Birth: [birth year]  Today's Date: 07/21/2022 Time: SLP Start Time (ACUTE ONLY): 0845 SLP Stop Time (ACUTE ONLY): 0945 SLP Time Calculation (min) (ACUTE ONLY): 60 min  Past Medical History:  Past Medical History:  Diagnosis Date   Hypertension    Past Surgical History:  Past Surgical History:  Procedure Laterality Date   CESAREAN SECTION     ELBOW LIGAMENT RECONSTRUCTION     TONSILLECTOMY AND ADENOIDECTOMY     HPI:  Pt is an 89 yo female who presented to [facility] ER from [facility] via EMS on 07/15 with lethargy and altered mental status.  PMH includes Alzheimer's Dis.  She has APS Guardianship.  Per ER notes, EMS reported pts heart rate initially in the 20's and sbp in the 60's.  Initial EKG showed complete heart block.  EMS proceeded to transcutaneously pace pt.     ED Course   Upon arrival to the ER while transitioning pt to zoll monitor pts heart rate went down to the 30's.  Transcutaneous pacing reestablished with a capture at 70 mA and rate of 80.  Pt also noted to be hypoxic with O2 sats in the 80's on 2L O2 via nasal canula, therefore O2 increased to 6L.  Pt was admitted to the ICU with complete heart block, acute hypoxic respiratory failure secondary to decompensated CHF vs. possible; acute kidney injury with hyperkalemia and metabolic acidosis, and severe lactic acidosis requiring transcutaneous pacing.    CXR at admit: Low volume film with diffuse interstitial and patchy airspace  disease bilaterally. Imaging features could be related to edema or  infection.  2. Presumed large hiatal hernia.    Assessment / Plan / Recommendation  Clinical Impression   Pt seen today for BSE. Pt awake, verbal and responded to basic questions re: self. Quite pleasant and smiled looking at her pictures, balloon in room. She was weak but assisted in leaning forward to sit up more in the bed then holding Cup  to drink. On [place] O2 5-7L; afebrile. WBC elevated.  Pt appears to present w/ suspected pharyngeal phase dysphagia in setting of declined Cognitive status, Baseline Dementia and current comorbidities w/ significantly declined medical status -- per NP/MD chart note today: "Cardiogenic Shock due to Complete Heart Block with resultant multiorgan failure including Acute Kidney Injury, Acute Liver Failure, and Acute Hypoxic Respiratory Failure with concern for aspiration pneumonia due to large hiatal hernia. Given her acute critical illness superimposed on advanced dementia and age, prognosis is extremely guarded (high risk for further decompensation, cardiac arrest, and death) and overall long term prognosis is extremely poor.".  Pt exhibited overt clinical s/s of aspiration w/ oral intake; she is at increased risk for aspiration/aspiration pneumonia. ANY Pulmonary and/or Cognitive decline can impact overall timing and awareness of swallowing thus impact safety during oral intake and increase risk for aspiration, choking and further Pulmonary decline. An oral diet cannot be recommended at this time. This was discussed w/ NSG/Team post eval.        Pt consumed few trials of ice chips, purees, and thin liquids via Cup. During positioning upright in bed, pt exhibited increased WOB and bradycardia (from her Baseline Bradycardia). W/ po trials, she exhibited inconsistent, overt clinical s/s of aspiration -- slight cough x2 and increased WOB w/ all trials. Rest Breaks were given b/t trials. No decline in vocal quality b/t trials. Oral phase was adequate for bolus management and oral  clearing of the boluses given. Timing of A-P transfer then swallow appeared [facility]. Pt attempted self-feeding by holding Cup.  OM Exam appeared [facility] w/ No unilateral weakness noted. Some confusion of OM tasks and oral care noted. Hand over hand guidance and visual cue were helpful to initiate tasks. Pt seemed to enjoy sips of juice.          In  setting of her medical presentation as per MD/chart notes, and her presentation of Bradycardia and WOB w/ exertion during po intake, an oral diet is NOT recommended at this time. Pt appears at HIGH risk for aspiration w/ oral intake. This was discussed w/ Team.  Recommend frequent oral care for hygiene and stimulation of swallowing, aspiration precautions. ST services will continue to monitor pt's medical status daily and GOC. Palliative Care is involved.   SLP Visit Diagnosis: Dysphagia, unspecified (R13.10)    Aspiration Risk  Moderate aspiration risk;Risk for inadequate nutrition/hydration (d/t medical status - cardiopulmonary issues)    Diet Recommendation   NPO w/ frequent oral care; aspiration precautions  Medication Administration: Via alternative means    Other  Recommendations Recommended Consults:  (Palliative Care is following; Dietician) Oral Care Recommendations: Oral care QID;Staff/trained caregiver to provide oral care    Recommendations for follow up therapy are one component of a multi-disciplinary discharge planning process, led by the attending physician.  Recommendations may be updated based on patient status, additional functional criteria and insurance authorization.  Follow up Recommendations Follow physician's recommendations for discharge plan and follow up therapies      Assistance Recommended at Discharge  full  Functional Status Assessment Patient has had a recent decline in their functional status and/or demonstrates limited ability to make significant improvements in function in a reasonable and predictable amount of time  Frequency and Duration  (TBD)   (TBD)       Prognosis Prognosis for improved oropharyngeal function: Guarded Barriers to Reach Goals: Time post onset;Severity of deficits;Cognitive deficits Barriers/Prognosis Comment: Medical status decline - increased respiratory effort w/ any exertion; bradycardia baseline      Swallow Study    General Date of Onset: 07/19/22 HPI: Pt is an 89 yo female who presented to [facility] ER from [facility] via EMS on 07/15 with lethargy and altered mental status.  PMH includes Alzheimer's Dis.  She has APS Guardianship.  Per ER notes, EMS reported pts heart rate initially in the 20's and sbp in the 60's.  Initial EKG showed complete heart block.  EMS proceeded to transcutaneously pace pt.     ED Course   Upon arrival to the ER while transitioning pt to zoll monitor pts heart rate went down to the 30's.  Transcutaneous pacing reestablished with a capture at 70 mA and rate of 80.  Pt also noted to be hypoxic with O2 sats in the 80's on 2L O2 via nasal canula, therefore O2 increased to 6L.  Pt was admitted to the ICU with complete heart block, acute hypoxic respiratory failure secondary to decompensated CHF vs. possible; acute kidney injury with hyperkalemia and metabolic acidosis, and severe lactic acidosis requiring transcutaneous pacing.    CXR at admit: Low volume film with diffuse interstitial and patchy airspace  disease bilaterally. Imaging features could be related to edema or  infection.  2. Presumed large hiatal hernia. Type of Study: Bedside Swallow Evaluation Previous Swallow Assessment: none Diet Prior to this Study: NPO Temperature Spikes Noted: No (wbc elevated) Respiratory Status: Nasal cannula (5-7L) History of Recent  Intubation: No Behavior/Cognition: Alert;Cooperative;Pleasant mood;Confused;Distractible;Requires cueing Oral Cavity Assessment: Within Functional Limits Oral Care Completed by SLP: Yes Oral Cavity - Dentition: Missing dentition Vision: Functional for self-feeding Self-Feeding Abilities: Able to feed self;Needs assist;Needs set up;Total assist (abel to hold cup w/ support) Patient Positioning: Upright in bed (needed support) Baseline Vocal Quality: Normal Volitional Cough: Cognitively unable to elicit Volitional Swallow: Unable to elicit    Oral/Motor/Sensory Function  Overall Oral Motor/Sensory Function: Within functional limits   Ice Chips Ice chips: Impaired Presentation:  (fed; 2 trials) Oral Phase Impairments:  (wfl) Pharyngeal Phase Impairments: Change in Vital Signs (increased respiratory effort w/ any exertion; bradycardia baseline)   Thin Liquid Thin Liquid: Impaired Presentation: Cup;Self Fed (4 trials) Oral Phase Impairments:  (wfl) Pharyngeal  Phase Impairments: Change in Vital Signs (increased respiratory effort w/ any exertion; bradycardia baseline)    Nectar Thick Nectar Thick Liquid: Not tested   Honey Thick Honey Thick Liquid: Not tested   Puree Puree: Impaired Presentation: Spoon (fed; 2 trials) Oral Phase Impairments:  (wfl) Pharyngeal Phase Impairments: Change in Vital Signs (increased respiratory effort w/ any exertion; bradycardia baseline)   Solid     Solid: Not tested        Jerilynn Som, MS, CCC-SLP Speech Language Pathologist Rehab Services; [facility] [place] [phone] (ascom) Cherylin Waguespack 07/21/2022,4:01 PM

## 2022-07-21 NOTE — Progress Notes (Signed)
Progress Note  Patient Name: Jasmin Roth Date of Encounter: 07/21/2022  Primary Cardiologist: New - consult by Kirke Corin  Subjective   She denies chest pain, dyspnea, palpitations, dizziness, presyncope, or syncope.  Unclear where she is.  Has been started on dopamine drip remains in complete heart block.  Awaiting further input from DSS.  Inpatient Medications    Scheduled Meds:  Chlorhexidine Gluconate Cloth  6 each Topical Daily   heparin  5,000 Units Subcutaneous Q8H   insulin aspart  0-9 Units Subcutaneous Q4H   mouth rinse  15 mL Mouth Rinse 4 times per day   Continuous Infusions:  dextrose 5% lactated ringers Stopped (07/21/22 0546)   DOPamine 10 mcg/kg/min (07/21/22 0700)   piperacillin-tazobactam (ZOSYN)  IV Stopped (07/21/22 0616)   PRN Meds: docusate sodium, ipratropium-albuterol, mouth rinse, polyethylene glycol   Vital Signs    Vitals:   07/21/22 0605 07/21/22 0630 07/21/22 0714 07/21/22 0730  BP:      Pulse: (!) 31 (!) 29  (!) 31  Resp: 16 15  15   Temp:    97.7 F (36.5 C)  TempSrc:    Oral  SpO2: 95% 99% 94% 92%  Weight:      Height:        Intake/Output Summary (Last 24 hours) at 07/21/2022 0958 Last data filed at 07/21/2022 0703 Gross per 24 hour  Intake 854.67 ml  Output 350 ml  Net 504.67 ml   Filed Weights   07/19/22 1234 07/20/22 0700  Weight: 50 kg 50.3 kg    Telemetry    Complete heart block with good junctional escape with rates in the 30s bpm - Personally Reviewed  ECG    No new tracings - Personally Reviewed  Physical Exam   GEN: No acute distress.   Neck: No JVD. Cardiac: Bradycardic, no murmurs, rubs, or gallops.  Respiratory: Diminished breath sounds bilaterally with faint expiratory wheezing.  GI: Soft, nontender, non-distended.   MS: No edema; No deformity. Neuro:  Alert; Nonfocal.  Psych: Normal affect.  Labs    Chemistry Recent Labs  Lab 07/19/22 0850 07/19/22 1354 07/20/22 0301 07/20/22 1008  07/20/22 1602 07/21/22 0414  NA 139   < > 148* 144 149* 150*  K 7.0*   < > 5.4* 4.5 4.2 3.7  CL 109   < > 110 108 110 112*  CO2 12*   < > 21* 21* 24 26  GLUCOSE 176*   < > 80 98 113* 122*  BUN 80*   < > 80* 86* 86* 91*  CREATININE 2.97*   < > 2.31* 2.64* 2.37* 2.14*  CALCIUM 9.4   < > 9.5 9.1 8.9 8.7*  PROT 7.7  --  6.6  --   --  6.2*  ALBUMIN 4.2  --  3.7  --   --  3.5  AST 193*  --  767*  --   --  876*  ALT 182*  --  919*  --   --  1,492*  ALKPHOS 76  --  65  --   --  75  BILITOT 1.6*  --  1.9*  --   --  2.3*  GFRNONAA 15*   < > 20* 17* 19* 22*  ANIONGAP 18*   < > 17* 15 15 12    < > = values in this interval not displayed.     Hematology Recent Labs  Lab 07/19/22 0850 07/20/22 0301 07/21/22 0414  WBC 12.9* 18.9* 19.0*  RBC  3.00* 2.74* 2.88*  HGB 9.8* 8.9* 9.3*  HCT 31.4* 26.8* 28.7*  MCV 104.7* 97.8 99.7  MCH 32.7 32.5 32.3  MCHC 31.2 33.2 32.4  RDW 19.8* 19.5* 19.9*  PLT 168 124* 111*    Cardiac EnzymesNo results for input(s): "TROPONINI" in the last 168 hours. No results for input(s): "TROPIPOC" in the last 168 hours.   BNP Recent Labs  Lab 07/19/22 0850  BNP 3,924.5*     DDimer  Recent Labs  Lab 07/19/22 0850  DDIMER 3.30*     Radiology    US RENAL  Result Date: 07/19/2022 IMPRESSION: Negative for hydronephrosis. Electronically Signed   By: Jasmine Pang M.D.   On: 07/19/2022 20:40   DG Chest Port 1 View  Result Date: 07/19/2022 IMPRESSION: 1. Low volume film with diffuse interstitial and patchy airspace disease bilaterally. Imaging features could be related to edema or infection. 2. Presumed large hiatal hernia. CT imaging may prove helpful to further evaluate. Electronically Signed   By: Kennith Center M.D.   On: 07/19/2022 09:27    Cardiac Studies   2D echo 07/19/2022: 1. Left ventricular ejection fraction, by estimation, is 60 to 65%. The  left ventricle has normal function. The left ventricle has no regional  wall motion abnormalities.  There is mild left ventricular hypertrophy.  Left ventricular diastolic parameters  are indeterminate.   2. Right ventricular systolic function is normal. The right ventricular  size is normal. There is normal pulmonary artery systolic pressure.   3. Left atrial size was moderately dilated.   4. The mitral valve is abnormal. No evidence of mitral valve  regurgitation. No evidence of mitral stenosis. Severe mitral annular  calcification.   5. Tricuspid valve regurgitation is mild to moderate.   6. The aortic valve is normal in structure. Aortic valve regurgitation is  not visualized. Aortic valve sclerosis/calcification is present, without  any evidence of aortic stenosis.   Patient Profile     89 y.o. female with history of dementia and HTN who we are evaluating for high-grade AV block.  Assessment & Plan    1.  High-grade AV block: -Previously on metoprolol 12.5 mg daily, held -Remains on dopamine drip which will be continued for now -Awaiting further input from DSS regarding GOC -Unlikely to be a candidate for pacemaker implantation given dementia, multiorgan failure, and advanced age -Avoid AV nodal blocking medications -TSH normal  2.  Elevated high-sensitivity troponin: -Mildly elevated and flat trending at 44 -Not consistent with ACS -Suspect this is secondary to supply/demand ischemia with underlying high-grade AV block -Echo with preserved LV systolic function and normal wall motion -Not a candidate for invasive ischemic testing with underlying dementia  3.  Elevated BNP: -Does not appear significantly volume overloaded, normal RVSP on echo this admission -No indication for diuresis at this time  4.  Hyperkalemia: -Improved  Remaining comorbidities per primary service    For questions or updates, please contact [facility] Please consult [email or web address] for contact info under Cardiology/STEMI.    Signed, Eula Listen, PA-C [facility] Pager: [phone] 07/21/2022, 9:58 AM

## 2022-07-21 NOTE — Procedures (Signed)
Central Venous Catheter Insertion Procedure Note  Jasmin Roth  [id number]  1933-11-25  Date:07/21/22  Time:12:03 PM   Provider Performing:Narvel Kozub D Elvina Sidle   Procedure: Insertion of Non-tunneled Central Venous Catheter(36556) with US guidance (40981)   Indication(s) Medication administration and Difficult access  Consent Unable to obtain consent due to emergent nature of procedure.  Anesthesia Topical only with 1% lidocaine   Timeout Verified patient identification, verified procedure, site/side was marked, verified correct patient position, special equipment/implants available, medications/allergies/relevant history reviewed, required imaging and test results available.  Sterile Technique Maximal sterile technique including full sterile barrier drape, hand hygiene, sterile gown, sterile gloves, mask, hair covering, sterile ultrasound probe cover (if used).  Procedure Description Area of catheter insertion was cleaned with chlorhexidine and draped in sterile fashion.  With real-time ultrasound guidance a central venous catheter was placed into the right femoral vein. Nonpulsatile blood flow and easy flushing noted in all ports.  The catheter was sutured in place and sterile dressing applied.  Complications/Tolerance None; patient tolerated the procedure well. Chest X-ray is ordered to verify placement for internal jugular or subclavian cannulation.   Chest x-ray is not ordered for femoral cannulation.  EBL Minimal  Specimen(s) None   Line secured at the 20 cm mark. BIOPATCH applied to the insertion site.    Harlon Ditty, AGACNP-BC  Pulmonary & Critical Care Prefer epic messenger for cross cover needs If after hours, please call E-link

## 2022-07-21 NOTE — Progress Notes (Signed)
NAME:  Jasmin Roth, MRN:  [id number], DOB:  27-Oct-1933, LOS: 2 ADMISSION DATE:  07/19/2022, CONSULTATION DATE: 07/19/2022 REFERRING MD: Dr. Marisa Severin, CHIEF COMPLAINT: Symptomatic Bradycardia   History of Present Illness:  This is an 89 yo female who presented to [facility] ER from [facility] via EMS on 07/15 with lethargy and altered mental status.  Per ER notes EMS reported pts heart rate initially in the 20's and sbp in the 60's.  Initial EKG showed complete heart block.  EMS proceeded to transcutaneously pace pt.  ED Course  Upon arrival to the ER while transitioning pt to zoll monitor pts heart rate went down to the 30's.  Transcutaneous pacing reestablished with a capture at 70 mA and rate of 80.  Pt also noted to be hypoxic with O2 sats in the 80's on 2L O2 via nasal canula, therefore O2 increased to 6L.  Significant lab results were: K+ 7.0/CO2 12/glucose 176/BUN 80/creatinine 2.97/anion gap 18/AST 193/ALT 182/BNP 3,924.5/troponin 30/lactic acid >9.0/wbc 12.9/hgb 9.8/COVID negative.  CXR concerning for pulmonary edema vs pneumonia.   Pt received 1g of calcium gluconate/cefepime/10 units of iv novolog/1 amp of D50W/1 amp of sodium bicarb/500 ml NS bolus/vancomycin.  Cardiology consulted per EDP for possible need for transvenous pacemaker placement. PCCM team contacted for ICU admission.      Pertinent  Medical History  Dementia  HTN   Significant Hospital Events: Including procedures, antibiotic start and stop dates in addition to other pertinent events   07/15: Pt admitted to ICU with complete heart block, acute hypoxic respiratory failure secondary to decompensated CHF vs. possible; acute kidney injury with hyperkalemia and metabolic acidosis, and severe lactic acidosis requiring transcutaneous pacing  07/20/22- patient is slightly more stable overnight.  She still is overall very sick with numerous severe comorbid conditions.  At this time I feel patient has very poor prognosis and  recommend hospice.  07/21/22- patient progressively deteriorating but is not in distress. She is less responsive and is bradycardic hypotensive.  Plan for comfort care measures  Micro Data:   07/15: COVID>>negative  07/15: Blood x2>> 07/15: MRSA PCR>>  Anti-infectives (From admission, onward)    Start     Dose/Rate Route Frequency Ordered Stop   07/19/22 1400  piperacillin-tazobactam (ZOSYN) IVPB 2.25 g        2.25 g 100 mL/hr over 30 Minutes Intravenous Every 8 hours 07/19/22 1242     07/19/22 1000  ceFEPIme (MAXIPIME) 2 g in sodium chloride 0.9 % 100 mL IVPB        2 g 200 mL/hr over 30 Minutes Intravenous  Once 07/19/22 0951 07/19/22 1112   07/19/22 1000  vancomycin (VANCOCIN) IVPB 1000 mg/200 mL premix        1,000 mg 200 mL/hr over 60 Minutes Intravenous  Once 07/19/22 0951 07/19/22 1122        Objective   Blood pressure (!) 124/55, pulse (!) 31, temperature 97.7 F (36.5 C), temperature source Oral, resp. rate 15, height 4\' 9"  (1.448 m), weight 50.3 kg, SpO2 92%.        Intake/Output Summary (Last 24 hours) at 07/21/2022 0955 Last data filed at 07/21/2022 0703 Gross per 24 hour  Intake 854.67 ml  Output 350 ml  Net 504.67 ml   Filed Weights   07/19/22 1234 07/20/22 0700  Weight: 50 kg 50.3 kg    Examination: General: Acute on chronically-ill appearing female, in respiratory distress on 6L O2 via nasal canula   HENT: JVD present  Lungs: Rhonchi throughout, tachypneic with accessory muscle use  Cardiovascular: Paced rhythm, no r/g, 2+ radial/2+ distal pulses, no edema  Abdomen: Hypoactive BS x4, obese, soft, non distended, non tender Extremities: Normal bulk and tone, moves all extremities  Neuro: Awake but confused, following commands, PERRLA  GU: Deferred   Resolved Hospital Problem list    Assessment & Plan:   #Acute metabolic encephalopathy  #Dementia appears advanced  - Correct metabolic derangements  - Avoid sedating medications when able - Frequent  reorientation   #Acute hypoxic respiratory failure secondary to decompensated CHF and possible pneumonia  - Supplemental O2 for dyspnea and/or hypoxia  - Maintain O2 sats 92% or higher  - Scheduled and prn bronchodilator therapy  - Intermittent CXR and ABG's  - HIGH RISK FOR INTUBATION - recommendation to de-escalate and transition to DNR hospice.   #Complete heart block  #Cardiogenic and possible septic shock  #New onset decompensated CHF #Mildly elevated troponin suspect secondary to demand ischemia  Hx: HTN  - Continuous telemetry monitoring  - Trend troponin's until peaked  - TSH, free T3, and cortisol level results pending  - Cardiology consulted appreciate input: recommended continued goals of care conversations due to overall poor prognosis  - Hold outpatient antihypertensives for now  - Avoid beta blocker therapy  - Echo pending  - Prn transcutaneous pacing for goal heart rate of at least 70 bpm   #Acute kidney injury with hyperkalemia and severe metabolic acidosis secondary to ATN  #Severe lactic acidosis  - Trend BMP, lactic acid, and vbg  - Replace electrolytes as indicated  - Monitor UOP  - Will administer 150 meq sodium bicarb   #Transaminitis secondary to hypotension  - Trend hepatic function panel  - Avoid hepatotoxic medications  - CT Abd/Pelvis pending   #Possible pneumonia suspect aspiration in setting of large hiatal hernia  - Trend WBC and monitor fever curve  - Trend PCT  - Follow cultures  - Continue abx as outlined above  #Anemia without obvious acute blood loss  - Trend CBC  - Monitor for s/sx of bleeding  - Transfuse for hgb <7 - Vitamin B12 and folic acid levels pending  #Hyperglycemia  - CBG's q4hrs  - SSI  - Target CBG reading: 140 to 180 - Hemoglobin A1c pending   Best Practice (right click and "Reselect all SmartList Selections" daily)   Diet/type: NPO DVT prophylaxis: prophylactic heparin  GI prophylaxis: N/A Lines: N/A Foley:   N/A Code Status:  full code Last date of multidisciplinary goals of care discussion [N/A]   Labs   CBC: Recent Labs  Lab 07/19/22 0850 07/20/22 0301 07/21/22 0414  WBC 12.9* 18.9* 19.0*  NEUTROABS 6.9  --  9.0*  HGB 9.8* 8.9* 9.3*  HCT 31.4* 26.8* 28.7*  MCV 104.7* 97.8 99.7  PLT 168 124* 111*    Basic Metabolic Panel: Recent Labs  Lab 07/19/22 0850 07/19/22 1354 07/19/22 1745 07/20/22 0301 07/20/22 1008 07/20/22 1602 07/21/22 0414  NA 139   < > 144 148* 144 149* 150*  K 7.0*   < > 5.1 5.4* 4.5 4.2 3.7  CL 109   < > 107 110 108 110 112*  CO2 12*   < > 23 21* 21* 24 26  GLUCOSE 176*   < > 100* 80 98 113* 122*  BUN 80*   < > 75* 80* 86* 86* 91*  CREATININE 2.97*   < > 2.32* 2.31* 2.64* 2.37* 2.14*  CALCIUM 9.4   < >  9.4 9.5 9.1 8.9 8.7*  MG 2.9*  --   --  2.3  --   --  2.5*  PHOS 7.9*  --   --  4.8*  --   --  5.5*   < > = values in this interval not displayed.   GFR: Estimated Creatinine Clearance: 12.9 mL/min (A) (by C-G formula based on SCr of 2.14 mg/dL (H)). Recent Labs  Lab 07/19/22 0850 07/19/22 0904 07/19/22 1354 07/19/22 1745 07/20/22 0301 07/20/22 1602 07/21/22 0414  PROCALCITON <0.10  --   --   --   --   --   --   WBC 12.9*  --   --   --  18.9*  --  19.0*  LATICACIDVEN >9.0* >9.0* 5.5* 4.3*  --  1.8  --     Liver Function Tests: Recent Labs  Lab 07/19/22 0850 07/20/22 0301 07/21/22 0414  AST 193* 767* 876*  ALT 182* 919* 1,492*  ALKPHOS 76 65 75  BILITOT 1.6* 1.9* 2.3*  PROT 7.7 6.6 6.2*  ALBUMIN 4.2 3.7 3.5   No results for input(s): "LIPASE", "AMYLASE" in the last 168 hours. No results for input(s): "AMMONIA" in the last 168 hours.  ABG    Component Value Date/Time   PHART 7.49 (H) 07/20/2022 1424   PCO2ART 36 07/20/2022 1424   PO2ART 110 (H) 07/20/2022 1424   HCO3 27.4 07/20/2022 1424   ACIDBASEDEF 4.5 (H) 07/20/2022 0820   O2SAT 99.2 07/20/2022 1424     Coagulation Profile: Recent Labs  Lab 07/19/22 1354  INR 1.8*     Cardiac Enzymes: No results for input(s): "CKTOTAL", "CKMB", "CKMBINDEX", "TROPONINI" in the last 168 hours.  HbA1C: Hgb A1c MFr Bld  Date/Time Value Ref Range Status  07/19/2022 01:54 PM 4.7 (L) 4.8 - 5.6 % Final    Comment:    (NOTE) Pre diabetes:          5.7%-6.4%  Diabetes:              >6.4%  Glycemic control for   <7.0% adults with diabetes   10/16/2019 02:27 PM 4.7 (L) 4.8 - 5.6 % Final    Comment:             Prediabetes: 5.7 - 6.4          Diabetes: >6.4          Glycemic control for adults with diabetes: <7.0     CBG: Recent Labs  Lab 07/20/22 1624 07/20/22 1922 07/20/22 2332 07/21/22 0337 07/21/22 0738  GLUCAP 106* 106* 100* 116* 109*    Review of Systems:   Unable to assess pt confused and has dementia   Past Medical History:  She,  has a past medical history of Hypertension.   Surgical History:   Past Surgical History:  Procedure Laterality Date   CESAREAN SECTION     ELBOW LIGAMENT RECONSTRUCTION     TONSILLECTOMY AND ADENOIDECTOMY       Social History:   reports that she has never smoked. She has never used smokeless tobacco. She reports that she does not drink alcohol and does not use drugs.   Family History:  Her family history includes Heart failure in her mother; Hypertension in her mother; Kidney failure in her father.   Allergies No Known Allergies   Home Medications  Prior to Admission medications   Medication Sig Start Date End Date Taking? Authorizing Provider  ADVAIR DISKUS 100-50 MCG/ACT AEPB Inhale 1 puff into the lungs  in the morning and at bedtime. 03/25/21   [provider]  amLODipine (NORVASC) 5 MG tablet TAKE 1 TABLET BY MOUTH DAILY 01/07/21   Malva Limes, MD  ASPIRIN LOW DOSE 81 MG EC tablet TAKE 1 TABLET BY MOUTH ONCE A DAY 01/07/21   Malva Limes, MD  ferrous sulfate 325 (65 FE) MG EC tablet Take 325 mg by mouth daily.     [provider]  hydrochlorothiazide (HYDRODIURIL) 12.5 MG tablet  Take 1 tablet by mouth daily. 07/23/21   [provider]  metoprolol tartrate (LOPRESSOR) 25 MG tablet TAKE 1/2 TABLET BY MOUTH DAILY 01/07/21   Malva Limes, MD  mupirocin cream (BACTROBAN) 2 % Apply 1 application topically 2 (two) times daily. 05/30/20   Malva Limes, MD  omeprazole (PRILOSEC) 20 MG capsule Take 1 capsule by mouth 1 day or 1 dose. 04/01/21   [provider]  potassium chloride SA (KLOR-CON M) 20 MEQ tablet TAKE 1 TABLET BY MOUTH ONCE A DAY 01/07/21   Malva Limes, MD  valsartan (DIOVAN) 40 MG tablet TAKE 1 TABLET BY MOUTH ONCE DAILY 01/07/21   Malva Limes, MD  vitamin C (ASCORBIC ACID) 500 MG tablet Take 500 mg by mouth daily. 04/16/21   [provider]     Critical care provider statement:   Total critical care time: 33 minutes   Performed by: Karna Christmas MD   Critical care time was exclusive of separately billable procedures and treating other patients.   Critical care was necessary to treat or prevent imminent or life-threatening deterioration.   Critical care was time spent personally by me on the following activities: development of treatment plan with patient and/or surrogate as well as nursing, discussions with consultants, evaluation of patient's response to treatment, examination of patient, obtaining history from patient or surrogate, ordering and performing treatments and interventions, ordering and review of laboratory studies, ordering and review of radiographic studies, pulse oximetry and re-evaluation of patient's condition.    Vida Rigger, M.D.  Pulmonary & Critical Care Medicine

## 2022-07-21 NOTE — IPAL (Addendum)
  Interdisciplinary Goals of Care Family Meeting   Date carried out: 07/21/2022  Location of the meeting: Phone conference  Member's involved: Nurse Practitioner and Social Worker  Durable Power of [place] or acting medical decision maker: Anselmo Pickler, APS Director of Guardianship    Discussion: We discussed goals of care for Jasmin Roth .  We reviewed Ms. Hinze's current clinical condition including Cardiogenic Shock due to Complete Heart Block with resultant multiorgan failure including Acute Kidney Injury, Acute Liver Failure, and Acute Hypoxic Respiratory Failure with concern for aspiration pneumonia due to large hiatal hernia.  Given her acute critical illness superimposed on advanced dementia and age, prognosis is extremely guarded (high risk for further decompensation, cardiac arrest, and death) and overall long term prognosis is extremely poor.  Currently having to support BP with Dopamine, she has failed speech evaluation with concern for aspiration.  Cardiology is following and has deemed pt a poor candidate for Pacemaker placement due to her advanced dementia, advanced age, and multiorgan failure.  Multiple physicians including Cardiology and Intensivist recommend DNR/DNI code status with transition to Hospice Care.  After our discussion, Legal guardian Ms Abran Cantor is in agreement with medical team's recommendations.  She gives consent to change code status to DNR/DNI, no escalation of care, and to transition to/initiate Hospice care.  Code status:   Code Status: DNR   Disposition: Home with Hospice  Time spent for the meeting: 15 minutes   Harlon Ditty, AGACNP-BC [facility] Prefer epic messenger for cross cover needs If after hours, please call E-link  Judithe Modest, NP  07/21/2022, 3:00 PM

## 2022-07-21 NOTE — TOC Progression Note (Signed)
Transition of Care [facility]) - Progression Note    Patient Details  Name: Jasmin Roth MRN: [id number] Date of Birth: April 11, 1933  [facility] CM/SW Contact  Kreg Shropshire, RN Phone Number: 07/21/2022, 12:16 PM  Clinical Narrative:     Cm called Caro Laroche of [facility] DSS to get more information about goals of care permission for pt. Jonathens Benna Dunks direct supervisor is Collier Flowers [phone]. Jonathen stated that Conard Novak is the The Director of APS/ Guardianship is [phone]. Cm notified Team       Expected Discharge Plan and Services                                               Social Determinants of Health (SDOH) Interventions SDOH Screenings   Depression (PHQ2-9): Low Risk  (01/12/2021)  Tobacco Use: Low Risk  (07/19/2022)    Readmission Risk Interventions     No data to display

## 2022-07-21 NOTE — Progress Notes (Signed)
Dopamine titrated off with no changes with heart rate. Blood pressure remains stable.

## 2022-07-22 DIAGNOSIS — F0390 Unspecified dementia without behavioral disturbance: Secondary | ICD-10-CM | POA: Insufficient documentation

## 2022-07-22 LAB — GLUCOSE, CAPILLARY
Glucose-Capillary: 107 mg/dL — ABNORMAL HIGH (ref 70–99)
Glucose-Capillary: 99 mg/dL (ref 70–99)

## 2022-07-22 MED ORDER — LORAZEPAM 1 MG PO TABS: 0.5000 mg | ORAL_TABLET | ORAL | Status: DC | PRN

## 2022-07-22 MED ORDER — GLYCOPYRROLATE 1 MG PO TABS: 1.0000 mg | ORAL_TABLET | Freq: Three times a day (TID) | ORAL | Status: DC | PRN

## 2022-07-22 MED ORDER — IPRATROPIUM-ALBUTEROL 0.5-2.5 (3) MG/3ML IN SOLN: 3.0000 mL | Freq: Four times a day (QID) | RESPIRATORY_TRACT | Status: DC | PRN

## 2022-07-22 MED ORDER — MORPHINE SULFATE 10 MG/5ML PO SOLN: 10.0000 mg | ORAL | Status: DC | PRN

## 2022-07-22 MED ORDER — ORAL CARE MOUTH RINSE: 15.0000 mL | OROMUCOSAL | Status: DC | PRN

## 2022-07-22 MED ORDER — DOCUSATE SODIUM 100 MG PO CAPS: 100.0000 mg | ORAL_CAPSULE | Freq: Two times a day (BID) | ORAL | Status: DC | PRN

## 2022-07-22 MED ORDER — LORAZEPAM 0.5 MG PO TABS: 0.5000 mg | ORAL_TABLET | ORAL | Status: DC | PRN

## 2022-07-22 MED ORDER — POLYETHYLENE GLYCOL 3350 17 G PO PACK: 17.0000 g | PACK | Freq: Every day | ORAL | Status: DC | PRN

## 2022-07-22 NOTE — TOC Transition Note (Signed)
Transition of Care [facility]) - CM/SW Discharge Note   Patient Details  Name: Jasmin Roth MRN: [id number] Date of Birth: 1933/07/26  Transition of Care [facility]) CM/SW Contact:  Kreg Shropshire, RN Phone Number: 07/22/2022, 10:40 AM   Clinical Narrative:    Pt approved for Hospice home today. Lenn Sink, [employer] and guardian notified. Florestine Avers to arrange transportation with non emergent ems. No further needs TOC signing off.   Final next level of care: Home w Hospice Care Barriers to Discharge: Barriers Resolved   Patient Goals and CMS Choice CMS Medicare.gov Compare Post Acute Care list provided to:: Patient Choice offered to / list presented to :  (presented to legal guardian)  Discharge Placement                  Patient to be transferred to facility by: non emergent ems Name of family member notified: Legal Guardian of [employer] [facility] Patient and family notified of of transfer: 07/22/22  Discharge Plan and Services Additional resources added to the After Visit Summary for                                       Social Determinants of Health (SDOH) Interventions SDOH Screenings   Depression (PHQ2-9): Low Risk  (01/12/2021)  Tobacco Use: Low Risk  (07/19/2022)     Readmission Risk Interventions     No data to display

## 2022-07-22 NOTE — Discharge Summary (Signed)
Physician Discharge Summary  Patient ID: Jasmin Roth MRN: [id number] DOB/AGE: 07-Aug-1933 89 y.o.  Admit date: 07/19/2022 Discharge date: 07/22/2022   Brief Pt Description / Synopsis:  89 y.o. female with PMHx significant for advanced dementia who is admitted with Cardiogenic Shock due to Complete Heart Block with resultant multiorgan failure including Acute Kidney Injury, Acute Liver Failure, and Acute Hypoxic Respiratory Failure with concern for aspiration pneumonia due to large hiatal hernia. Cardiology is following and has deemed pt a poor candidate for Pacemaker placement due to her advanced dementia, advanced age, and multiorgan failure.  Given her acute critical illness superimposed on advanced dementia and age, prognosis is extremely guarded (high risk for further decompensation, cardiac arrest, and death) and overall long term prognosis is extremely poor. She is being transitioned to Hospice care.   Discharge Diagnoses:   Cardiogenic Shock Complete Heart Block New onset Decompensated CHF Mildly elevated Troponin due to demand ischemia Acute Hypoxic Respiratory Failure Suspected Aspiration Pneumonia in setting of Large Hiatal Hernia Acute Kidney Injury Acute Liver Failure Anemia without obvious acute blood loss Acute Metabolic Encephalopathy superimposed on Dementia                                                            Discharge Summary:  Jasmin Roth is a 89 yo female who presented to [facility] ER from [facility] via EMS on 07/15 with lethargy and altered mental status.  Per ER notes EMS reported pts heart rate initially in the 20's and sbp in the 60's.  Initial EKG showed complete heart block.  EMS proceeded to transcutaneously pace pt.   ED Course  Upon arrival to the ER while transitioning pt to zoll monitor pts heart rate went down to the 30's.  Transcutaneous pacing reestablished with a capture at 70 mA and rate of 80.  Pt also noted to be hypoxic with O2 sats in the  80's on 2L O2 via nasal canula, therefore O2 increased to 6L.  Significant lab results were: K+ 7.0/CO2 12/glucose 176/BUN 80/creatinine 2.97/anion gap 18/AST 193/ALT 182/BNP 3,924.5/troponin 30/lactic acid >9.0/wbc 12.9/hgb 9.8/COVID negative.  CXR concerning for pulmonary edema vs pneumonia.   Pt received 1g of calcium gluconate/cefepime/10 units of iv novolog/1 amp of D50W/1 amp of sodium bicarb/500 ml NS bolus/vancomycin.  Cardiology consulted per EDP for possible need for transvenous pacemaker placement. PCCM team contacted for ICU admission.   Please see "Significant Hospital Events" section below for full detailed hospital course.   Discharge Plan by Diagnosis:   Pt is now being transitioned to Hospice Care.  Plan is Provide supportive care while treating pain/discomfort, dyspnea, and anxiety during the dying process.   Plan of care while she was in the ICU was as follows:  #Complete heart block  #Cardiogenic and possible septic shock  #New onset decompensated CHF #Mildly elevated troponin suspect secondary to demand ischemia  Hx: HTN  -Continuous cardiac monitoring -Maintain MAP >65 -Vasopressors as needed to maintain MAP goal ~ Dopamine weaned off -HS Troponin peaked at 44 -Cardiology following, appreciate input ~ deemed not a good candidate for pacemaker placement -Hold home antihypertensives and beta blockers -No diuresis at this time due to hypotension -TSH normal  #Acute hypoxic respiratory failure secondary to decompensated CHF and possible pneumonia  - Supplemental O2 for dyspnea  and/or hypoxia  - Maintain O2 sats 92% or higher  - Scheduled and prn bronchodilator therapy  - Intermittent CXR and ABG's  - ABX as above - Holding diuresis due to hypotension   #Acute kidney injury with hyperkalemia and severe metabolic acidosis secondary to ATN  #Severe lactic acidosis  -Monitor I&O's / urinary output -Follow BMP -Ensure adequate renal perfusion -Avoid nephrotoxic  agents as able -Replace electrolytes as indicated  #Possible pneumonia suspect aspiration in setting of large hiatal hernia  -Monitor fever curve -Trend WBC's & Procalcitonin -Follow cultures as above -Completed 3 days of empiric Zosyn pending cultures & sensitivities  #Acute Liver Failure due to shock -RUQ [place] negative; Acute Viral Hepatitis panel negative -Follow LFT's and coags  #Anemia without obvious acute blood loss  -Monitor for S/Sx of bleeding -Trend CBC -Transfuse for Hgb <7  #Acute metabolic encephalopathy  #Dementia appears advanced  -Treatment of metabolic derangements as outlined above -Provide supportive care -Promote normal sleep/wake cycle and family presence -Avoid sedating medications as able      Significant Events:  07/15: Pt admitted to ICU with complete heart block, acute hypoxic respiratory failure secondary to decompensated CHF vs. possible; acute kidney injury with hyperkalemia and metabolic acidosis, and severe lactic acidosis requiring transcutaneous pacing  07/20/22- patient is slightly more stable overnight.  She still is overall very sick with numerous severe comorbid conditions.  At this time I feel patient has very poor prognosis and recommend hospice.  07/21/22- patient progressively deteriorating but is not in distress. She is less responsive and is bradycardic hypotensive.  Cardiology has deemed her not a good candidate for pacemaker placement.   After discussion with assigned legal guardian, Code status changed to DNR/DNI, and plan to transition to Hospice Care. 07/22/22: Overnight with multiple cardiac pauses, remains bradycardic with HR in low 30's, BP has improved.  Discharging to hospice today.                Significant Diagnostic Studies:  7/15: Chest X-ray>>IMPRESSION: 1. Low volume film with diffuse interstitial and patchy airspace disease bilaterally. Imaging features could be related to edema or infection. 2. Presumed large hiatal  hernia. CT imaging may prove helpful to further evaluate. 7/15: Renal US>>IMPRESSION: Negative for hydronephrosis. 7/15: Echocardiogram>> LVEF 60-65%, indeterminate diastolic parameters,, mild LVH, RV systolic function normal, RV size normal, normal pulmonary artery systolic pressure, mild to moderate TR 7/16: RUQ Abdominal US>>IMPRESSION: No acute process or explanation for elevated liver function tests.            Micro Data:  07/15: COVID>>negative  07/15: Blood x2>> no growth to date 07/15: MRSA PCR>> negative  Antimicrobials:   Anti-infectives (From admission, onward)    Start     Dose/Rate Route Frequency Ordered Stop   07/19/22 1400  piperacillin-tazobactam (ZOSYN) IVPB 2.25 g  Status:  Discontinued        2.25 g 100 mL/hr over 30 Minutes Intravenous Every 8 hours 07/19/22 1242 07/22/22 0954   07/19/22 1000  ceFEPIme (MAXIPIME) 2 g in sodium chloride 0.9 % 100 mL IVPB        2 g 200 mL/hr over 30 Minutes Intravenous  Once 07/19/22 0951 07/19/22 1112   07/19/22 1000  vancomycin (VANCOCIN) IVPB 1000 mg/200 mL premix        1,000 mg 200 mL/hr over 60 Minutes Intravenous  Once 07/19/22 9604 07/19/22 1122         Consults:  PCCM Cardiology Palliative Care Speech Therapy   Discharge  Exam:  General: Acute on chronically-ill appearing female, on 2L [place], in no acute distress HENT: Atraumatic, normocephalic, neck supple Lungs: Coarse throughout, even, nonlabored, normal effort Cardiovascular: Bradycardia, regular rhythm, s1s2,no M/R/G, 2+ radial/2+ distal pulses, no edema  Abdomen: Hypoactive BS x4, obese, soft, non distended, non tender, no guarding or rebound tenderness Extremities: Normal bulk and tone, moves all extremities  Neuro: Sleeping, arouses to voice and gentle stimulation,confused, following commands, PERRLA  GU: Deferred    Vitals:   07/22/22 0100 07/22/22 0200 07/22/22 0207 07/22/22 0300  BP:      Pulse: (!) 30 (!) 25 99 (!) 33  Resp: (!) 33 (!) 21  (!) 31 (!) 26  Temp:      TempSrc:      SpO2: 100% 95% 99% 100%  Weight:      Height:         Discharge Labs:   BMET Recent Labs  Lab 07/19/22 0850 07/19/22 1354 07/19/22 1745 07/20/22 0301 07/20/22 1008 07/20/22 1602 07/21/22 0414  NA 139   < > 144 148* 144 149* 150*  K 7.0*   < > 5.1 5.4* 4.5 4.2 3.7  CL 109   < > 107 110 108 110 112*  CO2 12*   < > 23 21* 21* 24 26  GLUCOSE 176*   < > 100* 80 98 113* 122*  BUN 80*   < > 75* 80* 86* 86* 91*  CREATININE 2.97*   < > 2.32* 2.31* 2.64* 2.37* 2.14*  CALCIUM 9.4   < > 9.4 9.5 9.1 8.9 8.7*  MG 2.9*  --   --  2.3  --   --  2.5*  PHOS 7.9*  --   --  4.8*  --   --  5.5*   < > = values in this interval not displayed.    CBC Recent Labs  Lab 07/19/22 0850 07/20/22 0301 07/21/22 0414  HGB 9.8* 8.9* 9.3*  HCT 31.4* 26.8* 28.7*  WBC 12.9* 18.9* 19.0*  PLT 168 124* 111*    Anti-Coagulation Recent Labs  Lab 07/19/22 1354  INR 1.8*          Allergies as of 07/22/2022   No Known Allergies      Medication List     STOP taking these medications    Advair Diskus 100-50 MCG/ACT Aepb Generic drug: fluticasone-salmeterol   amLODipine 5 MG tablet Commonly known as: NORVASC   ascorbic acid 500 MG tablet Commonly known as: VITAMIN C   Aspirin Low Dose 81 MG tablet Generic drug: aspirin EC   ferrous sulfate 325 (65 FE) MG EC tablet   hydrochlorothiazide 12.5 MG tablet Commonly known as: HYDRODIURIL   hydrOXYzine 10 MG tablet Commonly known as: ATARAX   metoprolol tartrate 25 MG tablet Commonly known as: LOPRESSOR   mupirocin cream 2 % Commonly known as: Bactroban   omeprazole 20 MG capsule Commonly known as: PRILOSEC   pantoprazole 20 MG tablet Commonly known as: PROTONIX   potassium chloride SA 20 MEQ tablet Commonly known as: KLOR-CON M   valsartan 40 MG tablet Commonly known as: DIOVAN       TAKE these medications    docusate sodium 100 MG capsule Commonly known as: COLACE Take  1 capsule (100 mg total) by mouth 2 (two) times daily as needed for mild constipation.   glycopyrrolate 1 MG tablet Commonly known as: ROBINUL Take 1 tablet (1 mg total) by mouth 3 (three) times daily as needed (secretions).  ipratropium-albuterol 0.5-2.5 (3) MG/3ML Soln Commonly known as: DUONEB Take 3 mLs by nebulization every 6 (six) hours as needed.   LORazepam 0.5 MG tablet Commonly known as: ATIVAN Take 1 tablet (0.5 mg total) by mouth every 4 (four) hours as needed for anxiety.   morphine 10 MG/5ML solution Place 5 mLs (10 mg total) under the tongue every 4 (four) hours as needed.   mouth rinse Liqd solution 15 mLs by Mouth Rinse route every 4 (four) hours as needed (dry mouth).   mouth rinse Liqd solution 15 mLs by Mouth Rinse route as needed (oral care).   polyethylene glycol 17 g packet Commonly known as: MIRALAX / GLYCOLAX Take 17 g by mouth daily as needed for moderate constipation.            Disposition: Hospice Home  Discharged Condition: Jasmin Roth is felt not to benefit from aggressive inpatient care and is in the dying process.  Transitioning to Hospice Care.   Time spent on disposition:  40 Minutes.     Signed: Harlon Ditty, AGACNP-BC [facility] Prefer epic messenger for cross cover needs If after hours, please call E-link

## 2022-07-22 NOTE — Progress Notes (Signed)
Report given to hospice of [facility]

## 2022-07-22 NOTE — Progress Notes (Signed)
   07/22/22 0400  Spiritual Encounters  Type of Visit Initial  Care provided to: Pt and family  Referral source Family  Reason for visit End-of-life  OnCall Visit Yes  Spiritual Framework  Presenting Themes Coping tools;Significant life change  Family Stress Factors Major life changes  Interventions  Spiritual Care Interventions Made Established relationship of care and support;Compassionate presence;Prayer  Spiritual Care Plan  Spiritual Care Issues Still Outstanding Chaplain will continue to follow;Referring to oncoming chaplain for further support   Receive an page to come prayer at the patients beside. [place] was from the daughter who was at the bedside of the patient her mother. Patient is dying and does not know do to the daughter not willing to tell her because of the mother's dementia. Nurse is not sure if patient will make it through the Shift. Will check on patient and daughter and let oncoming chaplain know.

## 2022-07-22 NOTE — Progress Notes (Addendum)
[facility]- Civil engineer, contracting [facility])  Patient approved for admission to the Hospice Home today.  Lenn Sink, [employer] and guardian of patient, agreeable to transferring patient to [facility] today.      Please don't hesitate to call with any Hospice related questions or concerns.    Thank you for the opportunity to participate in this patient's care. [facility] Liaison [phone]

## 2022-07-22 NOTE — Progress Notes (Signed)
SLP Cancellation Note  Patient Details Name: Jasmin Roth MRN: [id number] DOB: 08-Nov-1933   Cancelled treatment:       Reason Eval/Treat Not Completed:  (chart reviewed)  Per chart review/notes, pt is transferring to [facility] today post discussion w/ Team and Guardian.  ST services can be available if any further needs.     Jerilynn Som, MS, CCC-SLP Speech Language Pathologist Rehab Services; [facility] [phone] (ascom) [name] 07/22/2022, 10:15 AM

## 2022-07-23 LAB — CULTURE, BLOOD (ROUTINE X 2)

## 2022-07-24 LAB — CULTURE, BLOOD (ROUTINE X 2)
Culture: NO GROWTH
Culture: NO GROWTH

## 2022-07-27 DIAGNOSIS — E782 Mixed hyperlipidemia: Secondary | ICD-10-CM | POA: Diagnosis not present

## 2022-07-27 DIAGNOSIS — I10 Essential (primary) hypertension: Secondary | ICD-10-CM | POA: Diagnosis not present

## 2022-08-05 DEATH — deceased
